# Patient Record
Sex: Female | Born: 1950
Health system: Southern US, Community
[De-identification: ages and names within clinical notes are randomized; demographics above are authoritative.]

## PROBLEM LIST (undated history)

## (undated) DIAGNOSIS — I10 Essential (primary) hypertension: Secondary | ICD-10-CM

## (undated) DIAGNOSIS — M1990 Unspecified osteoarthritis, unspecified site: Secondary | ICD-10-CM

## (undated) DIAGNOSIS — E785 Hyperlipidemia, unspecified: Secondary | ICD-10-CM

## (undated) DIAGNOSIS — K219 Gastro-esophageal reflux disease without esophagitis: Secondary | ICD-10-CM

## (undated) DIAGNOSIS — J449 Chronic obstructive pulmonary disease, unspecified: Secondary | ICD-10-CM

## (undated) DIAGNOSIS — E039 Hypothyroidism, unspecified: Secondary | ICD-10-CM

## (undated) HISTORY — DX: Essential (primary) hypertension: I10

## (undated) HISTORY — DX: Hypothyroidism, unspecified: E03.9

## (undated) HISTORY — DX: Gastro-esophageal reflux disease without esophagitis: K21.9

## (undated) HISTORY — PX: SHOULDER ARTHROSCOPY: SHX128

## (undated) HISTORY — DX: Hyperlipidemia, unspecified: E78.5

## (undated) HISTORY — PX: TONSILLECTOMY: SUR1361

---

## 1998-08-09 ENCOUNTER — Emergency Department (HOSPITAL_COMMUNITY): Admission: EM | Admit: 1998-08-09 | Discharge: 1998-08-09 | Payer: Self-pay | Admitting: Emergency Medicine

## 1999-12-01 ENCOUNTER — Other Ambulatory Visit: Admission: RE | Admit: 1999-12-01 | Discharge: 1999-12-01 | Payer: Self-pay | Admitting: Family Medicine

## 2001-08-11 ENCOUNTER — Ambulatory Visit (HOSPITAL_COMMUNITY): Admission: RE | Admit: 2001-08-11 | Discharge: 2001-08-11 | Payer: Self-pay | Admitting: Gastroenterology

## 2004-03-05 ENCOUNTER — Other Ambulatory Visit: Admission: RE | Admit: 2004-03-05 | Discharge: 2004-03-05 | Payer: Self-pay | Admitting: Family Medicine

## 2005-03-22 ENCOUNTER — Other Ambulatory Visit: Admission: RE | Admit: 2005-03-22 | Discharge: 2005-03-22 | Payer: Self-pay | Admitting: Family Medicine

## 2007-08-14 ENCOUNTER — Ambulatory Visit (HOSPITAL_COMMUNITY): Admission: RE | Admit: 2007-08-14 | Discharge: 2007-08-14 | Payer: Self-pay | Admitting: Gastroenterology

## 2009-03-10 ENCOUNTER — Other Ambulatory Visit: Admission: RE | Admit: 2009-03-10 | Discharge: 2009-03-10 | Payer: Self-pay | Admitting: Family Medicine

## 2010-06-03 ENCOUNTER — Other Ambulatory Visit: Payer: Self-pay | Admitting: Family Medicine

## 2010-06-03 ENCOUNTER — Other Ambulatory Visit
Admission: RE | Admit: 2010-06-03 | Discharge: 2010-06-03 | Payer: Self-pay | Source: Home / Self Care | Admitting: Family Medicine

## 2010-06-10 ENCOUNTER — Encounter
Admission: RE | Admit: 2010-06-10 | Discharge: 2010-06-10 | Payer: Self-pay | Source: Home / Self Care | Attending: Family Medicine | Admitting: Family Medicine

## 2010-09-29 NOTE — Op Note (Signed)
Wendy Blair, [name]:  [id number]   MEDICAL RECORD NO.:  [id number]          PATIENT TYPE:  AMB   LOCATION:  ENDO                         FACILITY:  [facility]   PHYSICIAN:  Anselmo Rod, M.D.  DATE OF BIRTH:  [birth year]   DATE OF PROCEDURE:  08/14/2007  DATE OF DISCHARGE:  08/14/2007                               OPERATIVE REPORT   PROCEDURE PERFORMED:  Screening colonoscopy.   ENDOSCOPIST:  Charna Elizabeth, M.D.   INSTRUMENT USED:  Pentax  video colonoscope.   INDICATIONS FOR PROCEDURE:  A 60 year old white female with a family  history of colonic polyps in multiple family members, undergoing  screening colonoscopy to rule out colonic polyps, masses etc.   PRE-PROCEDURE PREPARATION:  Informed consent was obtained  from the  patient.  The patient fasted four hours prior to the procedure and  prepped with a galon of Nulytely the night before and  the morning of  the procedure.  The risks and benefits of the procedure including 10%  missed rate of cancer and polyp were discussed with the patient as well.  Preprocedure physical, the patient stable vital signs, neck supple,  chest clear to auscultation, S1 and S2 regular, abdomen soft with normal  bowel sounds.  The patient has occasional rectal bleeding.   DESCRIPTION OF PROCEDURE:  The patient was placed in the left lateral  decubitus position and sedated with 60 mcg of Fentanyl, 6 mg of versed  given IV in incremental doses.  Once the patient was adequately sedated,  and maintained on low flow  oxygen and continuous cardiac monitoring,  then Pentax video colonoscope was advanced  from the rectum to the  cecum. An isolated diverticulum was noted in the cecum.  The appendiceal  orifice and cecum were clearly visualized and had no abnormalities  except for a cecal diverticulum. Internal hemorrhoids  were seen on  retroflexion.  The rest of the colonic mucosa appeared to be healthy and  without lesions.   IMPRESSION:  1. Small internal hemorrhoids seen on retroflexion.  2. Isolated diverticulum in the cecum.  3. Otherwise normal exam to the terminal ileum.   RECOMMENDATIONS:  1. Continue on high fiber diet with liberal fluid intake.  2. Repeat colonoscopy in the next 5 years unless the patient has any      abnormal symptoms in the interim, in which case, she should contact      the office immediately for further recommendations.  3. Outpatient follow up as needed in the future.      Anselmo Rod, M.D.  Electronically Signed     JNM/MEDQ  D:  08/17/2007  T:  08/17/2007  Job:  [id number]   cc:   Emeterio Reeve, MD  Fax: [phone]

## 2010-10-02 NOTE — Procedures (Signed)
[facility]  Patient:    Wendy Blair, Wendy Blair Visit Number: [id number] MRN: [id number]          Service Type: END Location: ENDO Attending Physician:  Charna Elizabeth Dictated by:   Anselmo Rod, M.D. Proc. Date: 08/11/01 Admit Date:  08/11/2001   CC:         Doreatha Lew, M.D.   Procedure Report  DATE OF BIRTH:  1950/07/22  PROCEDURE PERFORMED:  Colonoscopy.  ENDOSCOPIST:  Anselmo Rod, M.D.  INSTRUMENT USED:  Olympus video colonoscope.  INDICATION FOR PROCEDURE:  Rectal bleeding in a 60 year old white female with a family history of breast cancer and family history of colonic polyps.  Rule out polyps, masses, hemorrhoids, etc.  PREPROCEDURE PREPARATION:  Informed consent was procured from the patient. The patient was fasted for 8 hours prior to the procedure and prepped with a bottle of magnesium citrate and a gallon of NuLytely the night prior to the procedure.  PREPROCEDURE PHYSICAL:  Patient has stable vital signs.  NECK: Supple.  CHEST:  Clear to auscultation. S1, S2 regular.  ABDOMEN:  Soft with normal bowel sounds.  DESCRIPTION OF PROCEDURE:  The patient was placed in the left lateral decubitus position and sedated with 60 mg of Demerol and 6 mg of Versed intravenously.  Once the patient was adequately sedated and maintained on low-flow oxygen and continuous cardiac monitoring, the Olympus video colonoscope was advanced from the rectum to the cecum without difficulty. The patient had a fairly good prep.  No masses, polyps, erosions or ulcerations were seen.  There was no evidence of diverticulosis.  Prominent hemorrhoids were noted on retroflexion in the rectum.  These were small in size.  The patient tolerated the procedure well without complication. Appendiceal orifice and ileocecal valve were clearly visualized and photographed.  IMPRESSION:  Healthy-appearing colon up to the cecum, except for  small, nonbleeding internal hemorrhoids.  RECOMMENDATIONS: 1. A high-fiber diet has been recommended for the patient. 2. Anusol HC 2.5% suppositories one p.r. q.h.s. has been advised for now. 3. Repeat colorectal cancer screening in the next five years unless the    patient were to develop any abnormal symptoms in the interim. Dictated by:   Anselmo Rod, M.D. Attending Physician:  Charna Elizabeth DD:  08/11/01 TD:  08/12/01 Job: [id number]

## 2011-06-21 ENCOUNTER — Other Ambulatory Visit: Payer: Self-pay | Admitting: Family Medicine

## 2011-06-21 DIAGNOSIS — Z1231 Encounter for screening mammogram for malignant neoplasm of breast: Secondary | ICD-10-CM

## 2011-06-30 ENCOUNTER — Ambulatory Visit
Admission: RE | Admit: 2011-06-30 | Discharge: 2011-06-30 | Disposition: A | Discharge status: Home / Self Care | Source: Ambulatory Visit | Attending: Family Medicine | Admitting: Family Medicine

## 2011-06-30 DIAGNOSIS — Z1231 Encounter for screening mammogram for malignant neoplasm of breast: Secondary | ICD-10-CM

## 2012-06-22 ENCOUNTER — Other Ambulatory Visit (HOSPITAL_COMMUNITY): Payer: Self-pay | Admitting: Family Medicine

## 2012-06-22 DIAGNOSIS — R05 Cough: Secondary | ICD-10-CM

## 2012-06-22 DIAGNOSIS — R053 Chronic cough: Secondary | ICD-10-CM

## 2012-06-23 ENCOUNTER — Ambulatory Visit (HOSPITAL_COMMUNITY)
Admission: RE | Admit: 2012-06-23 | Discharge: 2012-06-23 | Disposition: A | Discharge status: Home / Self Care | Source: Ambulatory Visit | Attending: Family Medicine | Admitting: Family Medicine

## 2012-06-23 DIAGNOSIS — R0602 Shortness of breath: Secondary | ICD-10-CM | POA: Insufficient documentation

## 2012-06-23 DIAGNOSIS — S2249XA Multiple fractures of ribs, unspecified side, initial encounter for closed fracture: Secondary | ICD-10-CM | POA: Insufficient documentation

## 2012-06-23 DIAGNOSIS — J90 Pleural effusion, not elsewhere classified: Secondary | ICD-10-CM | POA: Insufficient documentation

## 2012-06-23 DIAGNOSIS — R05 Cough: Secondary | ICD-10-CM

## 2012-06-23 DIAGNOSIS — X58XXXA Exposure to other specified factors, initial encounter: Secondary | ICD-10-CM | POA: Insufficient documentation

## 2012-06-23 DIAGNOSIS — R053 Chronic cough: Secondary | ICD-10-CM

## 2012-06-23 DIAGNOSIS — R059 Cough, unspecified: Secondary | ICD-10-CM | POA: Insufficient documentation

## 2012-06-23 MED ORDER — IOHEXOL 350 MG/ML SOLN
100.0000 mL | Freq: Once | INTRAVENOUS | Status: AC | PRN
  Administered 2012-06-23: 100 mL via INTRAVENOUS

## 2012-08-16 ENCOUNTER — Ambulatory Visit (INDEPENDENT_AMBULATORY_CARE_PROVIDER_SITE_OTHER): Admitting: Internal Medicine

## 2012-08-16 ENCOUNTER — Other Ambulatory Visit

## 2012-08-16 ENCOUNTER — Encounter: Payer: Self-pay | Admitting: Internal Medicine

## 2012-08-16 VITALS — BP 140/72 | HR 89 | Ht 61.25 in | Wt 185.5 lb

## 2012-08-16 DIAGNOSIS — R053 Chronic cough: Secondary | ICD-10-CM

## 2012-08-16 DIAGNOSIS — R05 Cough: Secondary | ICD-10-CM

## 2012-08-16 DIAGNOSIS — R059 Cough, unspecified: Secondary | ICD-10-CM

## 2012-08-16 NOTE — Progress Notes (Signed)
08/16/12 61 yoF former smoker referred by Dr Mila Palmer - Ongoing cough and wheezing for past 5 months.  Her marriage separated in the summer of 2013 and she moved into her parents home. She is trying to clean up which he describes is a dusty, mold the place. Definitely less cough away from that house are at no previous history of asthma or pneumonia. She had an acute bronchitis in October of 2013, treated at urgent care with nebulizer prednisone and antibiotic. Persistent cough with scant clear mucus. Coughs till she retches. Triggering irritation in the back of her throat. Tussive rib fractures. Tramadol little help. Tussionex does help some. She is now on a prednisone taper. Little benefit from rescue albuterol inhaler or Dulera 200. Has done better with each of 5 rounds of prednisone tapering but begins coughing again after prednisone stops. Aware of postnasal drip but denies GERD. She is working as a Engineer, water with patient exposures. 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 CT chest 06/23/12- IMPRESSION:  Mildly displaced acute fractures of the right sixth, seventh and  eighth ribs with associated adjacent pleural thickening and a small  right pleural effusion. No pneumothorax is identified.  Original Report Authenticated By: Irish Lack, M.D.  Prior to Admission medications   Medication Sig Start Date End Date Taking? Authorizing Provider  buPROPion (WELLBUTRIN XL) 300 MG 24 hr tablet Take 300 mg by mouth daily.   Yes Historical Provider, MD  calcium carbonate (OS-CAL) 600 MG TABS Take 600 mg by mouth 2 (two) times daily with a meal.   Yes Historical Provider, MD  Cholecalciferol (VITAMIN D-3) 1000 UNITS CAPS Take 1 capsule by mouth 2 (two) times daily.   Yes Historical Provider, MD  cloNIDine (CATAPRES) 0.1 MG tablet Take 0.1 mg by mouth 2 (two) times daily.   Yes Historical Provider, MD  COD LIVER OIL PO Take 1 capsule by mouth daily.   Yes  Historical Provider, MD  GRAPE SEED EXTRACT PO Take 1 capsule by mouth daily.   Yes Historical Provider, MD  GREEN COFFEE BEAN PO Take 1 capsule by mouth daily.   Yes Historical Provider, MD  Chilton Si Tea, Camillia sinensis, (GREEN TEA EXTRACT PO) Take 1 capsule by mouth daily.   Yes Historical Provider, MD  levothyroxine (SYNTHROID) 50 MCG tablet Take 50 mcg by mouth daily before breakfast.   Yes Historical Provider, MD  loratadine (CLARITIN) 10 MG tablet Take 10 mg by mouth daily.   Yes Historical Provider, MD  montelukast (SINGULAIR) 10 MG tablet Take 10 mg by mouth at bedtime.   Yes Historical Provider, MD  Multiple Vitamins-Minerals (MULTIVITAMIN PO) Take 1 tablet by mouth daily.   Yes Historical Provider, MD  sertraline (ZOLOFT) 50 MG tablet Take 50 mg by mouth daily.   Yes Historical Provider, MD   Past Medical History  Diagnosis Date  . Hypertension   . Hypothyroidism   . Hyperlipidemia   . GERD (gastroesophageal reflux disease)    Past Surgical History  Procedure Laterality Date  . Tonsillectomy     Family History  Problem Relation Age of Onset  . Prostate cancer Father   . Lymphoma Brother   . Breast cancer Maternal Grandmother   . Stroke Paternal Grandmother   . Prostate cancer Brother    History   Social History  . Marital Status: Legally Separated    Spouse Name: N/A    Number of Children: N/A  . Years of Education: N/A  Occupational History  . Not on file.   Social History Main Topics  . Smoking status: Former Smoker -- 1.00 packs/day for 35 years    Types: Cigarettes    Quit date: 08/16/2005  . Smokeless tobacco: Never Used  . Alcohol Use: Yes  . Drug Use: No  . Sexually Active: Not on file   Other Topics Concern  . Not on file   Social History Narrative  . No narrative on file   ROS-see HPI Constitutional:   No-   weight loss, night sweats, fevers, chills, fatigue, lassitude. HEENT:   No-  headaches, difficulty swallowing, tooth/dental problems,  +sore throat,       +  sneezing, itching, ear ache, nasal congestion, +post nasal drip,  CV:  No-   chest pain, orthopnea, PND, swelling in lower extremities, anasarca,                                  dizziness, palpitations Resp: +  shortness of breath with exertion or at rest.              +   productive cough,  +non-productive cough,  No- coughing up of blood.              No-   change in color of mucus.  No- wheezing.   Skin: No-   rash or lesions. GI:  No-   heartburn, indigestion, abdominal pain, nausea, vomiting, diarrhea,                 change in bowel habits, loss of appetite GU: No-   dysuria, change in color of urine, no urgency or frequency.  No- flank pain. MS:  +   joint pain or swelling.  No- decreased range of motion.  No- back pain. Neuro-     nothing unusual Psych:  No- change in mood or affect. No depression or anxiety.  No memory loss.  OBJ- Physical Exam General- Alert, Oriented, Affect-appropriate, Distress- none acute. Overweight Skin- rash-none, lesions- none, excoriation- none Lymphadenopathy- none Head- atraumatic            Eyes- Gross vision intact, PERRLA, conjunctivae and secretions clear            Ears- Hearing, canals-normal            Nose- Clear, no-Septal dev, mucus, polyps, erosion, perforation             Throat- Mallampati II , mucosa +red , drainage- none, tonsils- atrophic Neck- flexible , trachea midline, no stridor , thyroid nl, carotid no bruit Chest - symmetrical excursion , unlabored           Heart/CV- RRR , no murmur , no gallop  , no rub, nl s1 s2                           - JVD- none , edema- none, stasis changes- none, varices- none           Lung- clear to P&A, wheeze- none, cough- none , dullness-none,            Chest wall- +pleural rub right lateral base Abd- tender-no, distended-no, bowel sounds-present, HSM- no Br/ Gen/ Rectal- Not done, not indicated Extrem- cyanosis- none, clubbing, none, atrophy- none, strength- nl Neuro-  grossly intact to observation

## 2012-08-16 NOTE — Patient Instructions (Addendum)
Order- lab Allergy Profile   Dx chronic cough  Order- schedule PFT  Sample Spiriva 1 daily  Consider formal evaluation of your home for mold/ dampness problem

## 2012-08-17 LAB — ALLERGY FULL PROFILE
Alternaria Alternata: <0.1 kU/L
Aspergillus fumigatus, m3: <0.1 kU/L
Bahia Grass: <0.1 kU/L
Bermuda Grass: <0.1 kU/L
Cat Dander: <0.1 kU/L
Curvularia lunata: <0.1 kU/L
D. farinae: <0.1 kU/L
Dog Dander: <0.1 kU/L
Fescue: <0.1 kU/L
Goldenrod: <0.1 kU/L
Helminthosporium halodes: <0.1 kU/L
Lamb's Quarters: <0.1 kU/L
Plantain: <0.1 kU/L
Sycamore Tree: <0.1 kU/L

## 2012-08-18 ENCOUNTER — Telehealth: Payer: Self-pay | Admitting: Internal Medicine

## 2012-08-18 NOTE — Telephone Encounter (Signed)
Result Note    Allergy antibodies are not elevated. We will discuss at next ov, but this suggests non-allergic reason for symptoms   --- I spoke with patient about results and she verbalized understanding and had no questions

## 2012-08-18 NOTE — Progress Notes (Signed)
Quick Note:  lmomtcb ______ 

## 2012-08-26 ENCOUNTER — Encounter: Payer: Self-pay | Admitting: Internal Medicine

## 2012-08-26 DIAGNOSIS — J45998 Other asthma: Secondary | ICD-10-CM | POA: Insufficient documentation

## 2012-08-26 NOTE — Assessment & Plan Note (Addendum)
Not clear if there is a reflux component with red throat and throat irritation as a trigger for coughing episodes. Reflux precautions were taught. Plan-schedule PFT, lab for allergy profile, try Spiriva. Educated on environmental dust and mold precautions with consideration of having home formally tested.

## 2012-09-22 ENCOUNTER — Encounter: Payer: Self-pay | Admitting: Internal Medicine

## 2012-09-22 ENCOUNTER — Ambulatory Visit (INDEPENDENT_AMBULATORY_CARE_PROVIDER_SITE_OTHER): Admitting: Internal Medicine

## 2012-09-22 ENCOUNTER — Ambulatory Visit: Admitting: Emergency Medicine

## 2012-09-22 VITALS — BP 152/90 | HR 84 | Ht 60.0 in | Wt 183.0 lb

## 2012-09-22 DIAGNOSIS — R059 Cough, unspecified: Secondary | ICD-10-CM

## 2012-09-22 DIAGNOSIS — J45909 Unspecified asthma, uncomplicated: Secondary | ICD-10-CM

## 2012-09-22 DIAGNOSIS — R05 Cough: Secondary | ICD-10-CM

## 2012-09-22 LAB — PULMONARY FUNCTION TEST

## 2012-09-22 NOTE — Patient Instructions (Addendum)
Sample Anoro Ellipta    1 puff then rinse mouth, once daily. Try this instead of Dulera and Spiriva. Let us know how you do with this.

## 2012-09-22 NOTE — Progress Notes (Signed)
PFT done today. 

## 2012-09-22 NOTE — Progress Notes (Signed)
08/16/12 61 yoF former smoker referred by Dr Mila Palmer - Ongoing cough and wheezing for past 5 months.  Her marriage separated in the summer of 2013 and she moved into her parents home. She is trying to clean up what she describes as a dusty, moldy place. Definitely less cough away from that house; no previous history of asthma or pneumonia. She had an acute bronchitis in October of 2013, treated at urgent care with nebulizer prednisone and antibiotic. Persistent cough with scant clear mucus. Coughs till she retches. Triggering irritation in the back of her throat. Tussive rib fractures. Tramadol little help. Tussionex does help some. She is now on a prednisone taper. Little benefit from rescue albuterol inhaler or Dulera 200. Has done better with each of 5 rounds of prednisone tapering but begins coughing again after prednisone stops. Aware of postnasal drip but denies GERD. She is working as a Engineer, water with patient exposures. 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 CT chest 06/23/12- IMPRESSION:  Mildly displaced acute fractures of the right sixth, seventh and  eighth ribs with associated adjacent pleural thickening and a small  right pleural effusion. No pneumothorax is identified.  Original Report Authenticated By: Irish Lack, M.D.  09/22/12- 72 yoF former smoker referred by Dr Mila Palmer - Ongoing cough and wheezing. Tussive rib fractures, Post-nasal drip FOLLOWS FOR: Reports that her breathing and cough have gotten worse. Denies chest tightness, chest pain or wheezing. States from time to time she does get up some mucus with her cough. Better air conditioning. Scant clear mucus. No longer has rib pain. Denies any reflux or heartburn at all but is following up with Dr. Lavonia Drafts. No benefit from albuterol or Dulera 200. Spiriva might help a little. Allergy Profile-negative-total IgE 11.9 PFT 09/22/12-: Mild obstructive airways disease with response to  bronchodilator, air trapping/increased residual volume. Normal diffusion capacity. FVC 2.01/71%, FEV1 1.44/66%, FEV1/FVC 0.72, FEF 25-75% 0.72, RV 141%, DLCO 100%.  ROS-see HPI Constitutional:   No-   weight loss, night sweats, fevers, chills, fatigue, lassitude. HEENT:   No-  headaches, difficulty swallowing, tooth/dental problems, +sore throat,       +  sneezing, itching, ear ache, nasal congestion, +post nasal drip,  CV:  No-   chest pain, orthopnea, PND, swelling in lower extremities, anasarca,                                  dizziness, palpitations Resp: +  shortness of breath with exertion or at rest.              +   productive cough,  +non-productive cough,  No- coughing up of blood.              No-   change in color of mucus.  No- wheezing.   Skin: No-   rash or lesions. GI:  No-   heartburn, indigestion, abdominal pain, nausea, vomiting,  GU:  MS:  +   joint pain or swelling.   Neuro-     nothing unusual Psych:  No- change in mood or affect. No depression or anxiety.  No memory loss.  OBJ- Physical Exam General- Alert, Oriented, Affect-appropriate, Distress- none acute. Overweight Skin- rash-none, lesions- none, excoriation- none Lymphadenopathy- none Head- atraumatic            Eyes- Gross vision intact, PERRLA, conjunctivae and secretions clear  Ears- Hearing, canals-normal            Nose- Clear, no-Septal dev, mucus, polyps, erosion, perforation             Throat- Mallampati II , mucosa +red , drainage- none, tonsils- atrophic Neck- flexible , trachea midline, no stridor , thyroid nl, carotid no bruit Chest - symmetrical excursion , unlabored           Heart/CV- RRR , no murmur , no gallop  , no rub, nl s1 s2                           - JVD- none , edema- none, stasis changes- none, varices- none           Lung- clear to P&A, wheeze- none, cough- none , dullness-none,            Chest wall- +pleural rub right lateral base Abd-  Br/ Gen/ Rectal- Not done,  not indicated Extrem- cyanosis- none, clubbing, none, atrophy- none, strength- nl Neuro- grossly intact to observation

## 2012-10-04 NOTE — Assessment & Plan Note (Signed)
Mild to moderate obstructive airways disease with slight response to bronchodilator. This began as a post viral syndrome and may be a bronchiolitis. Plan- Sample Anoro 1 puff daily

## 2012-11-03 ENCOUNTER — Ambulatory Visit (INDEPENDENT_AMBULATORY_CARE_PROVIDER_SITE_OTHER): Admitting: Internal Medicine

## 2012-11-03 ENCOUNTER — Encounter: Payer: Self-pay | Admitting: Internal Medicine

## 2012-11-03 VITALS — BP 140/72 | HR 88 | Ht 60.0 in | Wt 188.0 lb

## 2012-11-03 DIAGNOSIS — J4520 Mild intermittent asthma, uncomplicated: Secondary | ICD-10-CM

## 2012-11-03 DIAGNOSIS — J309 Allergic rhinitis, unspecified: Secondary | ICD-10-CM

## 2012-11-03 DIAGNOSIS — J45909 Unspecified asthma, uncomplicated: Secondary | ICD-10-CM

## 2012-11-03 MED ORDER — MOMETASONE FURO-FORMOTEROL FUM 200-5 MCG/ACT IN AERO: INHALATION_SPRAY | RESPIRATORY_TRACT | Status: DC

## 2012-11-03 NOTE — Patient Instructions (Addendum)
Sample and script Dulera 200   2 puffs then rinse mouth, twice daily - maintenance inhaler                     You can still use the albuterol rescue inhaler as needed  Sample Tudorza inhaler   1 puff, twice daily   You can use this right on top of the [facility] if you want  Sample Dymista nasal spray   1-2 puffs each nostril once daily at bedtime

## 2012-11-03 NOTE — Progress Notes (Signed)
08/16/12 61 yoF former smoker referred by Dr Mila Palmer - Ongoing cough and wheezing for past 5 months.  Her marriage separated in the summer of 2013 and she moved into her parents home. She is trying to clean up what she describes as a dusty, moldy place. Definitely less cough away from that house; no previous history of asthma or pneumonia. She had an acute bronchitis in October of 2013, treated at urgent care with nebulizer prednisone and antibiotic. Persistent cough with scant clear mucus. Coughs till she retches. Triggering irritation in the back of her throat. Tussive rib fractures. Tramadol little help. Tussionex does help some. She is now on a prednisone taper. Little benefit from rescue albuterol inhaler or Dulera 200. Has done better with each of 5 rounds of prednisone tapering but begins coughing again after prednisone stops. Aware of postnasal drip but denies GERD. She is working as a Engineer, water with patient exposures. 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 CT chest 06/23/12- IMPRESSION:  Mildly displaced acute fractures of the right sixth, seventh and  eighth ribs with associated adjacent pleural thickening and a small  right pleural effusion. No pneumothorax is identified.  Original Report Authenticated By: Irish Lack, M.D.  09/22/12- 58 yoF former smoker referred by Dr Mila Palmer - Ongoing cough and wheezing. Tussive rib fractures, Post-nasal drip FOLLOWS FOR: Reports that her breathing and cough have gotten worse. Denies chest tightness, chest pain or wheezing. States from time to time she does get up some mucus with her cough. Better air conditioning. Scant clear mucus. No longer has rib pain. Denies any reflux or heartburn at all but is following up with Dr. Lavonia Drafts. No benefit from albuterol or Dulera 200. Spiriva might help a little. Allergy Profile-negative-total IgE 11.9 PFT 09/22/12-: Mild obstructive airways disease with response to  bronchodilator, air trapping/increased residual volume. Normal diffusion capacity. FVC 2.01/71%, FEV1 1.44/66%, FEV1/FVC 0.72, FEF 25-75% 0.72, RV 141%, DLCO 100%.  11/03/12- 61 yoF former smoker referred by Dr Mila Palmer - Ongoing cough and wheezing. Tussive rib fractures, Post-nasal drip FOLLOWS FOR: continues to have good and bad days with cough-productive at times-clear in color. Always coughs if lying left side down. Cough will make her sit up. Coughs more with drinking water or lying on her back. Albuterol helps. Disliked the Spiriva and Anoro.  ROS-see HPI Constitutional:   No-   weight loss, night sweats, fevers, chills, fatigue, lassitude. HEENT:   No-  headaches, difficulty swallowing, tooth/dental problems, +sore throat,       +  sneezing, itching, ear ache, nasal congestion, +post nasal drip,  CV:  No-   chest pain, orthopnea, PND, swelling in lower extremities, anasarca,                                  dizziness, palpitations Resp: +  shortness of breath with exertion or at rest.              +   productive cough,  +non-productive cough,  No- coughing up of blood.              No-   change in color of mucus.  No- wheezing.   Skin: No-   rash or lesions. GI:  No-   heartburn, indigestion, abdominal pain, nausea, vomiting,  GU:  MS:  +   joint pain or swelling.   Neuro-  nothing unusual Psych:  No- change in mood or affect. No depression or anxiety.  No memory loss.  OBJ- Physical Exam General- Alert, Oriented, Affect-appropriate, Distress- none acute. Overweight Skin- rash-none, lesions- none, excoriation- none Lymphadenopathy- none Head- atraumatic            Eyes- Gross vision intact, PERRLA, conjunctivae and secretions clear            Ears- Hearing, canals-normal            Nose- +sniffing and snorting, no-Septal dev, mucus, polyps, erosion, perforation             Throat- Mallampati II , mucosa +red , drainage+clear mucus, tonsils- atrophic Neck- flexible ,  trachea midline, no stridor , thyroid nl, carotid no bruit Chest - symmetrical excursion , unlabored           Heart/CV- RRR , no murmur , no gallop  , no rub, nl s1 s2                           - JVD- none , edema- none, stasis changes- none, varices- none           Lung- clear to P&A, wheeze- none, cough- none , dullness-none,            Chest wall- no rub Abd-  Br/ Gen/ Rectal- Not done, not indicated Extrem- cyanosis- none, clubbing, none, atrophy- none, strength- nl Neuro- grossly intact to observation

## 2012-11-06 ENCOUNTER — Telehealth: Payer: Self-pay | Admitting: Internal Medicine

## 2012-11-06 MED ORDER — AZITHROMYCIN 250 MG PO TABS: ORAL_TABLET | ORAL | Status: DC

## 2012-11-06 NOTE — Telephone Encounter (Signed)
Pt was seen on June 20th and over the weekend Pt state has ran fever of 102 all weekend and now is down to 101. Pt taking ibuprofen ,coughing enough to gag,  Sob,wheezing, sinus drainage. Allergies  Allergen Reactions  . Penicillins     Rash and itching   Dr Maple Hudson please advise Thank you

## 2012-11-06 NOTE — Telephone Encounter (Signed)
Per CY-offer Zpak #1 take as directed no refills.  

## 2012-11-06 NOTE — Telephone Encounter (Signed)
Called spoke with patient, advised of CY's recs as stated below.  Pt okay with these recs and verbalized her understanding.  Rx sent to verified pharmacy.  Pt aware to call back if symptoms do not improve or worsen.

## 2012-11-19 DIAGNOSIS — J309 Allergic rhinitis, unspecified: Secondary | ICD-10-CM | POA: Insufficient documentation

## 2012-11-19 NOTE — Assessment & Plan Note (Signed)
Postnasal drip. Plan-sample Dymista

## 2012-11-19 NOTE — Assessment & Plan Note (Signed)
Cough he quit of lung for asthma Plan-refill Elwin Sleight, try [place]

## 2012-12-18 ENCOUNTER — Telehealth: Payer: Self-pay | Admitting: Internal Medicine

## 2012-12-18 MED ORDER — ALBUTEROL SULFATE HFA 108 (90 BASE) MCG/ACT IN AERS: 2.0000 | INHALATION_SPRAY | RESPIRATORY_TRACT | Status: DC | PRN

## 2012-12-18 NOTE — Telephone Encounter (Signed)
[facility] Nothing further was needed

## 2012-12-18 NOTE — Telephone Encounter (Signed)
Pt does not have proair on medlist and this does not show we have filed this in the past. Please advise if okay to send in proair RX for pt Dr. Maple Hudson thanks Last OV 11/03/12

## 2012-12-18 NOTE — Telephone Encounter (Signed)
Per CY- ok RX ProAir HFA #1 2 puffs Q4H prn refill prn.

## 2013-02-09 ENCOUNTER — Encounter: Payer: Self-pay | Admitting: Internal Medicine

## 2013-02-09 ENCOUNTER — Ambulatory Visit (INDEPENDENT_AMBULATORY_CARE_PROVIDER_SITE_OTHER): Admitting: Internal Medicine

## 2013-02-09 ENCOUNTER — Ambulatory Visit: Admitting: Internal Medicine

## 2013-02-09 VITALS — BP 150/80 | HR 114 | Ht 60.0 in | Wt 186.6 lb

## 2013-02-09 DIAGNOSIS — J45909 Unspecified asthma, uncomplicated: Secondary | ICD-10-CM

## 2013-02-09 DIAGNOSIS — J45998 Other asthma: Secondary | ICD-10-CM

## 2013-02-09 MED ORDER — BUDESONIDE-FORMOTEROL FUMARATE 160-4.5 MCG/ACT IN AERO: INHALATION_SPRAY | RESPIRATORY_TRACT | Status: DC

## 2013-02-09 NOTE — Patient Instructions (Addendum)
Sample and script Symbicort 160    2 puffs then rinse mouth twice every day- maintenance     Use this instead of Dulera  Please call as needed  Be carefull chewing and swallowing and don't lie down for at least an hour after eating to avoid reflux.

## 2013-02-09 NOTE — Progress Notes (Signed)
08/16/12 61 yoF former smoker referred by Dr Mila Palmer - Ongoing cough and wheezing for past 5 months.  Her marriage separated in the summer of 2013 and she moved into her parents home. She is trying to clean up what she describes as a dusty, moldy place. Definitely less cough away from that house; no previous history of asthma or pneumonia. She had an acute bronchitis in October of 2013, treated at urgent care with nebulizer prednisone and antibiotic. Persistent cough with scant clear mucus. Coughs till she retches. Triggering irritation in the back of her throat. Tussive rib fractures. Tramadol little help. Tussionex does help some. She is now on a prednisone taper. Little benefit from rescue albuterol inhaler or Dulera 200. Has done better with each of 5 rounds of prednisone tapering but begins coughing again after prednisone stops. Aware of postnasal drip but denies GERD. She is working as a Engineer, water with patient exposures. 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 CT chest 06/23/12- IMPRESSION:  Mildly displaced acute fractures of the right sixth, seventh and  eighth ribs with associated adjacent pleural thickening and a small  right pleural effusion. No pneumothorax is identified.  Original Report Authenticated By: Irish Lack, M.D.  09/22/12- 60 yoF former smoker referred by Dr Mila Palmer - Ongoing cough and wheezing. Tussive rib fractures, Post-nasal drip FOLLOWS FOR: Reports that her breathing and cough have gotten worse. Denies chest tightness, chest pain or wheezing. States from time to time she does get up some mucus with her cough. Better air conditioning. Scant clear mucus. No longer has rib pain. Denies any reflux or heartburn at all but is following up with Dr. Lavonia Drafts. No benefit from albuterol or Dulera 200. Spiriva might help a little. Allergy Profile-negative-total IgE 11.9 PFT 09/22/12-: Mild obstructive airways disease with response to  bronchodilator, air trapping/increased residual volume. Normal diffusion capacity. FVC 2.01/71%, FEV1 1.44/66%, FEV1/FVC 0.72, FEF 25-75% 0.72, RV 141%, DLCO 100%.  11/03/12- 61 yoF former smoker referred by Dr Mila Palmer - Ongoing cough and wheezing. Tussive rib fractures, Post-nasal drip FOLLOWS FOR: continues to have good and bad days with cough-productive at times-clear in color. Always coughs if lying left side down. Cough will make her sit up. Coughs more with drinking water or lying on her back. Albuterol helps. Disliked the Spiriva and Anoro.  02/09/13- 61 yoF former smoker referred by Dr Mila Palmer - Ongoing cough and wheezing. Tussive rib fractures, Post-nasal drip FOLLOWS FOR: continues to have cough; has not had Dulera inhaler since last visit(insurance would not cover) Gets flu shot at work Occasional wheeze and cough. Gag seasonally but not aware of reflux. Not using nasal spray regularly.  ROS-see HPI Constitutional:   No-   weight loss, night sweats, fevers, chills, fatigue, lassitude. HEENT:   No-  headaches, difficulty swallowing, tooth/dental problems, +sore throat,       No-  sneezing, itching, ear ache, nasal congestion, +post nasal drip,  CV:  No-   chest pain, orthopnea, PND, swelling in lower extremities, anasarca, dizziness, palpitations Resp: +  shortness of breath with exertion or at rest.              No- productive cough,  +non-productive cough,  No- coughing up of blood.              No-   change in color of mucus.  No- wheezing.   Skin: No-   rash or lesions. GI:  No-  heartburn, indigestion, abdominal pain, nausea, vomiting,  GU:  MS:  +   joint pain or swelling.   Neuro-     nothing unusual Psych:  No- change in mood or affect. No depression or anxiety.  No memory loss.  OBJ- Physical Exam General- Alert, Oriented, Affect-appropriate, Distress- none acute. Overweight Skin- rash-none, lesions- none, excoriation- none Lymphadenopathy- none Head-  atraumatic            Eyes- Gross vision intact, PERRLA, conjunctivae and secretions clear            Ears- Hearing, canals-normal            Nose- +mild turbinate edema, no-Septal dev, mucus, polyps, erosion, perforation             Throat- Mallampati II , mucosa +red , drainage+clear mucus, tonsils- atrophic Neck- flexible , trachea midline, no stridor , thyroid nl, carotid no bruit Chest - symmetrical excursion , unlabored           Heart/CV- RRR , no murmur , no gallop  , no rub, nl s1 s2                           - JVD- none , edema- none, stasis changes- none, varices- none           Lung- clear to P&A, wheezy cough+ , dullness-none,            Chest wall- no rub Abd-  Br/ Gen/ Rectal- Not done, not indicated Extrem- cyanosis- none, clubbing, none, atrophy- none, strength- nl Neuro- grossly intact to observation

## 2013-02-18 NOTE — Assessment & Plan Note (Signed)
Discussed use of a maintenance controller Plan-add Symbicort 160

## 2013-02-27 ENCOUNTER — Other Ambulatory Visit: Payer: Self-pay | Admitting: Internal Medicine

## 2013-02-27 MED ORDER — FLUTICASONE-SALMETEROL 250-50 MCG/DOSE IN AEPB: 1.0000 | INHALATION_SPRAY | Freq: Two times a day (BID) | RESPIRATORY_TRACT | Status: DC

## 2013-02-27 NOTE — Telephone Encounter (Signed)
I called verbal Rx to [facility] at [facility]; changed on medication list and patient is aware of this change. Pt will come by the office this afternoon so I may teach her how to use Advair.

## 2013-06-11 ENCOUNTER — Telehealth: Payer: Self-pay | Admitting: Internal Medicine

## 2013-06-11 MED ORDER — FLUTICASONE-SALMETEROL 250-50 MCG/DOSE IN AEPB: 1.0000 | INHALATION_SPRAY | Freq: Two times a day (BID) | RESPIRATORY_TRACT | Status: DC

## 2013-06-11 NOTE — Telephone Encounter (Signed)
Spoke with pt and is aware of [facility] recs. RX has been sent. notihng further needed

## 2013-06-11 NOTE — Telephone Encounter (Signed)
lmomtcb x1 at #'s left

## 2013-06-11 NOTE — Telephone Encounter (Signed)
She should have an active script for Advair 250 in force. I would start that first. If it doesn't control here, we will call in a short term script for pred taper.

## 2013-06-11 NOTE — Telephone Encounter (Signed)
Spoke with pt. States that she has been coughing with production of clear mucus and wheezing x3 days. Has not had her maintence inhaler due to lack of insurance. She now is working for [employer] and has insurance. Wanting to know if she needs to get back on her inhaler or can we just call in prednisone?  Allergies  Allergen Reactions  . Penicillins     Rash and itching    Current Outpatient Prescriptions on File Prior to Visit  Medication Sig Dispense Refill  . albuterol (PROAIR HFA) 108 (90 BASE) MCG/ACT inhaler Inhale 2 puffs into the lungs every 4 (four) hours as needed for wheezing.  3 Inhaler  prn  . buPROPion (WELLBUTRIN XL) 300 MG 24 hr tablet Take 300 mg by mouth daily.      . calcium carbonate (OS-CAL) 600 MG TABS Take 600 mg by mouth 2 (two) times daily with a meal.      . Cholecalciferol (VITAMIN D-3) 1000 UNITS CAPS Take 1 capsule by mouth 2 (two) times daily.      . cloNIDine (CATAPRES) 0.1 MG tablet Take 0.1 mg by mouth 2 (two) times daily.      . COD LIVER OIL PO Take 1 capsule by mouth daily.      . Fluticasone-Salmeterol (ADVAIR DISKUS) 250-50 MCG/DOSE AEPB Inhale 1 puff into the lungs 2 (two) times daily. RINSE MOUTH WELL AFTER USE  60 each  11  . GRAPE SEED EXTRACT PO Take 1 capsule by mouth daily.      Marland Kitchen GREEN COFFEE BEAN PO Take 1 capsule by mouth daily.      Nyoka Cowden Tea, Camillia sinensis, (GREEN TEA EXTRACT PO) Take 1 capsule by mouth daily.      Marland Kitchen levothyroxine (SYNTHROID) 50 MCG tablet Take 50 mcg by mouth daily before breakfast.      . loratadine (CLARITIN) 10 MG tablet Take 10 mg by mouth daily.      . montelukast (SINGULAIR) 10 MG tablet Take 10 mg by mouth at bedtime.      . Multiple Vitamins-Minerals (MULTIVITAMIN PO) Take 1 tablet by mouth daily.      . sertraline (ZOLOFT) 50 MG tablet Take 50 mg by mouth daily.       No current facility-administered medications on file prior to visit.    CY - please advise. Thanks.

## 2013-06-11 NOTE — Telephone Encounter (Signed)
Returning call can be reached at [phone] for the next 30 minutes and at [phone] after 5p.Wendy Blair

## 2013-06-12 ENCOUNTER — Ambulatory Visit: Admitting: Internal Medicine

## 2013-06-13 ENCOUNTER — Telehealth: Payer: Self-pay | Admitting: Internal Medicine

## 2013-06-13 MED ORDER — PREDNISONE 10 MG PO TABS: ORAL_TABLET | ORAL | Status: DC

## 2013-06-13 NOTE — Telephone Encounter (Signed)
Offer prednisone 10 mg, # 20, 4 X 2 DAYS, 3 X 2 DAYS, 2 X 2 DAYS, 1 X 2 DAYS When this finishes, start back on Advair 250.Marland Kitchen

## 2013-06-13 NOTE — Telephone Encounter (Signed)
Pt returned call.  Spoke with patient who reported that she has had 4 doses of the Advair rx'd by CDY.  She is still waking up coughing and wheezing and during the day as well.  Mucus is clear and frothy, increased SOB, runny nose with clear mucus, PND and sweats.  Pt denies any fever, chills, nausea, vomiting, hemoptysis.  Pt is requesting further recs from [place]. Dr Annamaria Boots please advise, thank you.

## 2013-06-13 NOTE — Telephone Encounter (Signed)
lmomtcb on pt's named VM 

## 2013-06-13 NOTE — Telephone Encounter (Signed)
Per last phone note 06-11-13:  Deneise Lever, MD at 06/11/2013 5:00 PM     Status: Signed        She should have an active script for Advair 250 in force. I would start that first. If it doesn't control here, we will call in a short term script for pred taper.    [id number] to speak to pt. [place] Bing, CMA

## 2013-06-13 NOTE — Telephone Encounter (Signed)
Spoke with the pt and notified of recs per [facility]  She verbalized understanding and denies any questions  Rx was sent to pharm

## 2013-06-13 NOTE — Telephone Encounter (Signed)
Pt returned call

## 2013-06-15 ENCOUNTER — Other Ambulatory Visit: Payer: Self-pay | Admitting: Family Medicine

## 2013-06-15 ENCOUNTER — Other Ambulatory Visit (HOSPITAL_COMMUNITY)
Admission: RE | Admit: 2013-06-15 | Discharge: 2013-06-15 | Disposition: A | Discharge status: Home / Self Care | Payer: 59 | Source: Ambulatory Visit | Attending: Family Medicine | Admitting: Family Medicine

## 2013-06-15 DIAGNOSIS — Z124 Encounter for screening for malignant neoplasm of cervix: Secondary | ICD-10-CM | POA: Insufficient documentation

## 2013-06-29 ENCOUNTER — Ambulatory Visit (INDEPENDENT_AMBULATORY_CARE_PROVIDER_SITE_OTHER): Payer: 59 | Admitting: Internal Medicine

## 2013-06-29 ENCOUNTER — Encounter: Payer: Self-pay | Admitting: Internal Medicine

## 2013-06-29 VITALS — BP 140/82 | HR 93 | Ht 60.0 in | Wt 185.0 lb

## 2013-06-29 DIAGNOSIS — J45909 Unspecified asthma, uncomplicated: Secondary | ICD-10-CM

## 2013-06-29 DIAGNOSIS — J45998 Other asthma: Secondary | ICD-10-CM

## 2013-06-29 MED ORDER — FLUTICASONE-SALMETEROL 115-21 MCG/ACT IN AERO
INHALATION_SPRAY | RESPIRATORY_TRACT | Status: DC
Start: 2013-06-29 — End: 2020-01-30

## 2013-06-29 NOTE — Patient Instructions (Addendum)
Script printed for Advair HFA. You can try this in comparison with Advair diskus if your insurance will cover it.

## 2013-06-29 NOTE — Progress Notes (Signed)
08/16/12 61 yoF former smoker referred by Dr Jonathon Jordan - Ongoing cough and wheezing for past 5 months.  Her marriage separated in the summer of 2013 and she moved into her parents home. She is trying to clean up what she describes as a dusty, moldy place. Definitely less cough away from that house; no previous history of asthma or pneumonia. She had an acute bronchitis in October of 2013, treated at urgent care with nebulizer prednisone and antibiotic. Persistent cough with scant clear mucus. Coughs till she retches. Triggering irritation in the back of her throat. Tussive rib fractures. Tramadol little help. Tussionex does help some. She is now on a prednisone taper. Little benefit from rescue albuterol inhaler or Dulera 200. Has done better with each of 5 rounds of prednisone tapering but begins coughing again after prednisone stops. Aware of postnasal drip but denies GERD. She is working as a Scientist, research (physical sciences) with patient exposures. 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 CT chest 06/23/12- IMPRESSION:  Mildly displaced acute fractures of the right sixth, seventh and  eighth ribs with associated adjacent pleural thickening and a small  right pleural effusion. No pneumothorax is identified.  Original Report Authenticated By: Aletta Edouard, M.D.  09/22/12- 48 yoF former smoker referred by Dr Jonathon Jordan - Ongoing cough and wheezing. Tussive rib fractures, Post-nasal drip FOLLOWS FOR: Reports that her breathing and cough have gotten worse. Denies chest tightness, chest pain or wheezing. States from time to time she does get up some mucus with her cough. Better air conditioning. Scant clear mucus. No longer has rib pain. Denies any reflux or heartburn at all but is following up with Dr. Hoyt Koch. No benefit from albuterol or Dulera 200. Spiriva might help a little. Allergy Profile-negative-total IgE 11.9 PFT 09/22/12-: Mild obstructive airways disease with response to  bronchodilator, air trapping/increased residual volume. Normal diffusion capacity. FVC 2.01/71%, FEV1 1.44/66%, FEV1/FVC 0.72, FEF 25-75% 0.72, RV 141%, DLCO 100%.  11/03/12- 89 yoF former smoker referred by Dr Jonathon Jordan - Ongoing cough and wheezing. Tussive rib fractures, Post-nasal drip FOLLOWS FOR: continues to have good and bad days with cough-productive at times-clear in color. Always coughs if lying left side down. Cough will make her sit up. Coughs more with drinking water or lying on her back. Albuterol helps. Disliked the Spiriva and Anoro.  02/09/13- 43 yoF former smoker referred by Dr Jonathon Jordan - Ongoing cough and wheezing. Tussive rib fractures, Post-nasal drip FOLLOWS FOR: continues to have cough; has not had Dulera inhaler since last visit(insurance would not cover) Gets flu shot at work Occasional wheeze and cough. Gag seasonally but not aware of reflux. Not using nasal spray regularly.  06/29/13- 34 yoF former smoker referred by Dr Jonathon Jordan - Ongoing cough and wheezing. Tussive rib fractures, Post-nasal drip FOLLOWS FOR:  Cough and breathing improved since last OV-- No concerns today  She prefers an HFA metered inhaler but insurance prefers Advair brand.  ROS-see HPI Constitutional:   No-   weight loss, night sweats, fevers, chills, fatigue, lassitude. HEENT:   No-  headaches, difficulty swallowing, tooth/dental problems, +sore throat,       No-  sneezing, itching, ear ache, nasal congestion, +post nasal drip,  CV:  No-   chest pain, orthopnea, PND, swelling in lower extremities, anasarca, dizziness, palpitations Resp: +  shortness of breath with exertion or at rest.              No- productive cough,  +  non-productive cough,  No- coughing up of blood.              No-   change in color of mucus.  No- wheezing.   Skin: No-   rash or lesions. GI:  No-   heartburn, indigestion, abdominal pain, nausea, vomiting,  GU:  MS:  +   joint pain or swelling.   Neuro-      nothing unusual Psych:  No- change in mood or affect. No depression or anxiety.  No memory loss.  OBJ- Physical Exam General- Alert, Oriented, Affect-appropriate, Distress- none acute. Overweight Skin- rash-none, +fever blister lip, excoriation- none Lymphadenopathy- none Head- atraumatic            Eyes- Gross vision intact, PERRLA, conjunctivae and secretions clear            Ears- Hearing, canals-normal            Nose- +mild turbinate edema, no-Septal dev, mucus, polyps, erosion, perforation             Throat- Mallampati II , mucosa +red , drainage+clear mucus, tonsils- atrophic Neck- flexible , trachea midline, no stridor , thyroid nl, carotid no bruit Chest - symmetrical excursion , unlabored           Heart/CV- RRR , no murmur , no gallop  , no rub, nl s1 s2                           - JVD- none , edema- none, stasis changes- none, varices- none           Lung- clear to P&A, wheeze-none cough-none , dullness-none,            Chest wall- no rub Abd-  Br/ Gen/ Rectal- Not done, not indicated Extrem- cyanosis- none, clubbing, none, atrophy- none, strength- nl Neuro- grossly intact to observation

## 2013-07-23 ENCOUNTER — Encounter: Payer: Self-pay | Admitting: Internal Medicine

## 2013-07-23 NOTE — Assessment & Plan Note (Signed)
Good control now. Plan-change to Advair 115 HFA for maintenance inhaler

## 2013-07-24 ENCOUNTER — Other Ambulatory Visit (HOSPITAL_COMMUNITY): Payer: Self-pay | Admitting: Family Medicine

## 2013-07-24 DIAGNOSIS — Z0000 Encounter for general adult medical examination without abnormal findings: Secondary | ICD-10-CM

## 2013-08-07 ENCOUNTER — Ambulatory Visit (HOSPITAL_COMMUNITY)
Admission: RE | Admit: 2013-08-07 | Discharge: 2013-08-07 | Disposition: A | Discharge status: Home / Self Care | Payer: 59 | Source: Ambulatory Visit | Attending: Family Medicine | Admitting: Family Medicine

## 2013-08-07 ENCOUNTER — Other Ambulatory Visit (HOSPITAL_COMMUNITY): Payer: Self-pay | Admitting: Family Medicine

## 2013-08-07 DIAGNOSIS — Z0000 Encounter for general adult medical examination without abnormal findings: Secondary | ICD-10-CM

## 2013-08-07 DIAGNOSIS — Z1231 Encounter for screening mammogram for malignant neoplasm of breast: Secondary | ICD-10-CM | POA: Insufficient documentation

## 2013-12-27 ENCOUNTER — Ambulatory Visit (INDEPENDENT_AMBULATORY_CARE_PROVIDER_SITE_OTHER): Payer: 59 | Admitting: Internal Medicine

## 2013-12-27 ENCOUNTER — Encounter: Payer: Self-pay | Admitting: Internal Medicine

## 2013-12-27 VITALS — BP 162/98 | HR 75 | Ht 60.0 in | Wt 190.0 lb

## 2013-12-27 DIAGNOSIS — R062 Wheezing: Secondary | ICD-10-CM

## 2013-12-27 NOTE — Patient Instructions (Signed)
We can continue present meds  Please call as needed 

## 2013-12-27 NOTE — Progress Notes (Signed)
08/16/12 61 yoF former smoker referred by Dr Jonathon Jordan - Ongoing cough and wheezing for past 5 months.  Her marriage separated in the summer of 2013 and she moved into her parents home. She is trying to clean up what she describes as a dusty, moldy place. Definitely less cough away from that house; no previous history of asthma or pneumonia. She had an acute bronchitis in October of 2013, treated at urgent care with nebulizer prednisone and antibiotic. Persistent cough with scant clear mucus. Coughs till she retches. Triggering irritation in the back of her throat. Tussive rib fractures. Tramadol little help. Tussionex does help some. She is now on a prednisone taper. Little benefit from rescue albuterol inhaler or Dulera 200. Has done better with each of 5 rounds of prednisone tapering but begins coughing again after prednisone stops. Aware of postnasal drip but denies GERD. She is working as a Scientist, research (physical sciences) with patient exposures. 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 CT chest 06/23/12- IMPRESSION:  Mildly displaced acute fractures of the right sixth, seventh and  eighth ribs with associated adjacent pleural thickening and a small  right pleural effusion. No pneumothorax is identified.  Original Report Authenticated By: Aletta Edouard, M.D.  09/22/12- 89 yoF former smoker referred by Dr Jonathon Jordan - Ongoing cough and wheezing. Tussive rib fractures, Post-nasal drip FOLLOWS FOR: Reports that her breathing and cough have gotten worse. Denies chest tightness, chest pain or wheezing. States from time to time she does get up some mucus with her cough. Better air conditioning. Scant clear mucus. No longer has rib pain. Denies any reflux or heartburn at all but is following up with Dr. Hoyt Koch. No benefit from albuterol or Dulera 200. Spiriva might help a little. Allergy Profile-negative-total IgE 11.9 PFT 09/22/12-: Mild obstructive airways disease with response to  bronchodilator, air trapping/increased residual volume. Normal diffusion capacity. FVC 2.01/71%, FEV1 1.44/66%, FEV1/FVC 0.72, FEF 25-75% 0.72, RV 141%, DLCO 100%.  11/03/12- 57 yoF former smoker referred by Dr Jonathon Jordan - Ongoing cough and wheezing. Tussive rib fractures, Post-nasal drip FOLLOWS FOR: continues to have good and bad days with cough-productive at times-clear in color. Always coughs if lying left side down. Cough will make her sit up. Coughs more with drinking water or lying on her back. Albuterol helps. Disliked the Spiriva and Anoro.  02/09/13- 78 yoF former smoker referred by Dr Jonathon Jordan - Ongoing cough and wheezing. Tussive rib fractures, Post-nasal drip FOLLOWS FOR: continues to have cough; has not had Dulera inhaler since last visit(insurance would not cover) Gets flu shot at work Occasional wheeze and cough. Gag seasonally but not aware of reflux. Not using nasal spray regularly.  06/29/13- 84 yoF former smoker referred by Dr Jonathon Jordan - Ongoing cough and wheezing. Tussive rib fractures, Post-nasal drip FOLLOWS FOR:  Cough and breathing improved since last OV-- No concerns today  She prefers an HFA metered inhaler but insurance prefers Advair brand.  12/27/13-63 yoF former smoker referred by Dr Jonathon Jordan - Ongoing cough and wheezing.  Hx Tussive rib fractures, Post-nasal drip  FOLLOWS FOR: has dry cough in the mornings; denies any wheezing or SOB.           ROS-see HPI Constitutional:   No-   weight loss, night sweats, fevers, chills, fatigue, lassitude. HEENT:   No-  headaches, difficulty swallowing, tooth/dental problems, +sore throat,       No-  sneezing, itching, ear ache, nasal congestion, +post nasal drip,  CV:  No-   chest pain, orthopnea, PND, swelling in lower extremities, anasarca, dizziness, palpitations Resp: +  shortness of breath with exertion or at rest.              No- productive cough,  +non-productive cough,  No- coughing up of  blood.              No-   change in color of mucus.  No- wheezing.   Skin: No-   rash or lesions. GI:  No-   heartburn, indigestion, abdominal pain, nausea, vomiting,  GU:  MS:  +   joint pain or swelling.   Neuro-     nothing unusual Psych:  No- change in mood or affect. No depression or anxiety.  No memory loss.  OBJ- Physical Exam General- Alert, Oriented, Affect-appropriate, Distress- none acute. Overweight Skin- rash-none, +fever blister lip, excoriation- none Lymphadenopathy- none Head- atraumatic            Eyes- Gross vision intact, PERRLA, conjunctivae and secretions clear            Ears- Hearing, canals-normal            Nose- +mild turbinate edema, no-Septal dev, mucus, polyps, erosion, perforation             Throat- Mallampati II , mucosa +red , drainage+clear mucus, tonsils- atrophic Neck- flexible , trachea midline, no stridor , thyroid nl, carotid no bruit Chest - symmetrical excursion , unlabored           Heart/CV- RRR , no murmur , no gallop  , no rub, nl s1 s2                           - JVD- none , edema- none, stasis changes- none, varices- none           Lung- clear to P&A, wheeze-none cough-none , dullness-none,            Chest wall- no rub Abd-  Br/ Gen/ Rectal- Not done, not indicated Extrem- cyanosis- none, clubbing, none, atrophy- none, strength- nl Neuro- grossly intact to observation

## 2015-02-11 ENCOUNTER — Other Ambulatory Visit: Payer: Self-pay

## 2015-02-11 DIAGNOSIS — Z1231 Encounter for screening mammogram for malignant neoplasm of breast: Secondary | ICD-10-CM

## 2015-03-11 ENCOUNTER — Ambulatory Visit
Admission: RE | Admit: 2015-03-11 | Discharge: 2015-03-11 | Disposition: A | Discharge status: Home / Self Care | Payer: 59 | Source: Ambulatory Visit

## 2015-03-11 DIAGNOSIS — Z1231 Encounter for screening mammogram for malignant neoplasm of breast: Secondary | ICD-10-CM

## 2015-05-20 DIAGNOSIS — J301 Allergic rhinitis due to pollen: Secondary | ICD-10-CM | POA: Diagnosis not present

## 2015-05-20 DIAGNOSIS — J3081 Allergic rhinitis due to animal (cat) (dog) hair and dander: Secondary | ICD-10-CM | POA: Diagnosis not present

## 2015-05-20 DIAGNOSIS — J3089 Other allergic rhinitis: Secondary | ICD-10-CM | POA: Diagnosis not present

## 2015-05-22 MED FILL — MOMETASONE FUROATE 50 MCG S: 50 | 30 days supply | Qty: 17 | Fill #3

## 2015-05-22 MED FILL — LEVOCETIRIZINE 5 MG TABLET: 5 | 30 days supply | Qty: 30 | Fill #0

## 2015-05-26 MED FILL — MONTELUKAST SOD 10 MG TAB: 10 | 30 days supply | Qty: 30 | Fill #4

## 2015-05-26 MED FILL — AMLODIPINE BESYLATE 10 MG T: 10 | 90 days supply | Qty: 90 | Fill #3

## 2015-05-26 MED FILL — TOPROL XL 100 MG TABLET SA: 100 | 30 days supply | Qty: 30 | Fill #9

## 2015-05-30 DIAGNOSIS — J3081 Allergic rhinitis due to animal (cat) (dog) hair and dander: Secondary | ICD-10-CM | POA: Diagnosis not present

## 2015-05-30 DIAGNOSIS — J301 Allergic rhinitis due to pollen: Secondary | ICD-10-CM | POA: Diagnosis not present

## 2015-05-30 DIAGNOSIS — J3089 Other allergic rhinitis: Secondary | ICD-10-CM | POA: Diagnosis not present

## 2015-06-11 DIAGNOSIS — J3081 Allergic rhinitis due to animal (cat) (dog) hair and dander: Secondary | ICD-10-CM | POA: Diagnosis not present

## 2015-06-11 DIAGNOSIS — J301 Allergic rhinitis due to pollen: Secondary | ICD-10-CM | POA: Diagnosis not present

## 2015-06-11 DIAGNOSIS — J3089 Other allergic rhinitis: Secondary | ICD-10-CM | POA: Diagnosis not present

## 2015-06-18 DIAGNOSIS — J3081 Allergic rhinitis due to animal (cat) (dog) hair and dander: Secondary | ICD-10-CM | POA: Diagnosis not present

## 2015-06-18 DIAGNOSIS — J3089 Other allergic rhinitis: Secondary | ICD-10-CM | POA: Diagnosis not present

## 2015-06-18 DIAGNOSIS — J301 Allergic rhinitis due to pollen: Secondary | ICD-10-CM | POA: Diagnosis not present

## 2015-06-19 MED FILL — LEVOCETIRIZINE 5 MG TABLET: 5 | 30 days supply | Qty: 30 | Fill #1

## 2015-06-19 MED FILL — LEVOTHYROXINE 50 MCG TABLET: 50 | 90 days supply | Qty: 90 | Fill #0

## 2015-06-23 MED FILL — MONTELUKAST SOD 10 MG TAB: 10 | 30 days supply | Qty: 30 | Fill #5

## 2015-06-23 MED FILL — BUPROPION HCL XL 300 MG TAB: 300 | 90 days supply | Qty: 90 | Fill #0

## 2015-06-23 MED FILL — TOPROL XL 100 MG TABLET SA: 100 | 30 days supply | Qty: 30 | Fill #10

## 2015-06-25 DIAGNOSIS — J3089 Other allergic rhinitis: Secondary | ICD-10-CM | POA: Diagnosis not present

## 2015-06-25 DIAGNOSIS — J3081 Allergic rhinitis due to animal (cat) (dog) hair and dander: Secondary | ICD-10-CM | POA: Diagnosis not present

## 2015-06-25 DIAGNOSIS — J301 Allergic rhinitis due to pollen: Secondary | ICD-10-CM | POA: Diagnosis not present

## 2015-07-02 DIAGNOSIS — J301 Allergic rhinitis due to pollen: Secondary | ICD-10-CM | POA: Diagnosis not present

## 2015-07-02 DIAGNOSIS — J3089 Other allergic rhinitis: Secondary | ICD-10-CM | POA: Diagnosis not present

## 2015-07-02 DIAGNOSIS — J3081 Allergic rhinitis due to animal (cat) (dog) hair and dander: Secondary | ICD-10-CM | POA: Diagnosis not present

## 2015-07-07 MED FILL — MOMETASONE FUROATE 50 MCG S: 50 | 30 days supply | Qty: 17 | Fill #4

## 2015-07-07 MED FILL — ADVAIR HFA 115-21 MCG INH: 115-21 | 30 days supply | Qty: 12 | Fill #3

## 2015-07-08 MED FILL — HYDROCHLOROTHIAZIDE 25 MG T: 25 | 90 days supply | Qty: 90 | Fill #0

## 2015-07-08 MED FILL — SIMVASTATIN 20 MG TABLET: 20 | 90 days supply | Qty: 90 | Fill #0

## 2015-07-09 DIAGNOSIS — J3089 Other allergic rhinitis: Secondary | ICD-10-CM | POA: Diagnosis not present

## 2015-07-09 DIAGNOSIS — J301 Allergic rhinitis due to pollen: Secondary | ICD-10-CM | POA: Diagnosis not present

## 2015-07-09 DIAGNOSIS — J3081 Allergic rhinitis due to animal (cat) (dog) hair and dander: Secondary | ICD-10-CM | POA: Diagnosis not present

## 2015-07-21 DIAGNOSIS — J301 Allergic rhinitis due to pollen: Secondary | ICD-10-CM | POA: Diagnosis not present

## 2015-07-21 DIAGNOSIS — R632 Polyphagia: Secondary | ICD-10-CM | POA: Diagnosis not present

## 2015-07-21 DIAGNOSIS — Z79899 Other long term (current) drug therapy: Secondary | ICD-10-CM | POA: Diagnosis not present

## 2015-07-21 DIAGNOSIS — M47816 Spondylosis without myelopathy or radiculopathy, lumbar region: Secondary | ICD-10-CM | POA: Diagnosis not present

## 2015-07-21 DIAGNOSIS — J3089 Other allergic rhinitis: Secondary | ICD-10-CM | POA: Diagnosis not present

## 2015-07-21 DIAGNOSIS — J3081 Allergic rhinitis due to animal (cat) (dog) hair and dander: Secondary | ICD-10-CM | POA: Diagnosis not present

## 2015-07-21 DIAGNOSIS — J45909 Unspecified asthma, uncomplicated: Secondary | ICD-10-CM | POA: Diagnosis not present

## 2015-07-21 DIAGNOSIS — E039 Hypothyroidism, unspecified: Secondary | ICD-10-CM | POA: Diagnosis not present

## 2015-07-21 DIAGNOSIS — Z0000 Encounter for general adult medical examination without abnormal findings: Secondary | ICD-10-CM | POA: Diagnosis not present

## 2015-07-21 DIAGNOSIS — E559 Vitamin D deficiency, unspecified: Secondary | ICD-10-CM | POA: Diagnosis not present

## 2015-07-21 DIAGNOSIS — E7800 Pure hypercholesterolemia, unspecified: Secondary | ICD-10-CM | POA: Diagnosis not present

## 2015-07-21 DIAGNOSIS — I10 Essential (primary) hypertension: Secondary | ICD-10-CM | POA: Diagnosis not present

## 2015-07-21 MED FILL — METOPROLOL TARTRATE 50 MG T: 50 | 30 days supply | Qty: 60 | Fill #0

## 2015-07-22 MED FILL — SERTRALINE HCL 50 MG TABLET: 50 | 90 days supply | Qty: 90 | Fill #0

## 2015-07-22 MED FILL — MONTELUKAST SOD 10 MG TAB: 10 | 30 days supply | Qty: 30 | Fill #0

## 2015-07-22 MED FILL — LEVOCETIRIZINE 5 MG TABLET: 5 | 30 days supply | Qty: 30 | Fill #0

## 2015-07-30 DIAGNOSIS — J301 Allergic rhinitis due to pollen: Secondary | ICD-10-CM | POA: Diagnosis not present

## 2015-07-30 DIAGNOSIS — J3089 Other allergic rhinitis: Secondary | ICD-10-CM | POA: Diagnosis not present

## 2015-07-30 DIAGNOSIS — J4540 Moderate persistent asthma, uncomplicated: Secondary | ICD-10-CM | POA: Diagnosis not present

## 2015-07-30 DIAGNOSIS — J3081 Allergic rhinitis due to animal (cat) (dog) hair and dander: Secondary | ICD-10-CM | POA: Diagnosis not present

## 2015-07-30 DIAGNOSIS — R21 Rash and other nonspecific skin eruption: Secondary | ICD-10-CM | POA: Diagnosis not present

## 2015-07-30 DIAGNOSIS — L501 Idiopathic urticaria: Secondary | ICD-10-CM | POA: Diagnosis not present

## 2015-08-05 DIAGNOSIS — J3081 Allergic rhinitis due to animal (cat) (dog) hair and dander: Secondary | ICD-10-CM | POA: Diagnosis not present

## 2015-08-05 DIAGNOSIS — J3089 Other allergic rhinitis: Secondary | ICD-10-CM | POA: Diagnosis not present

## 2015-08-05 DIAGNOSIS — J301 Allergic rhinitis due to pollen: Secondary | ICD-10-CM | POA: Diagnosis not present

## 2015-08-12 DIAGNOSIS — J3089 Other allergic rhinitis: Secondary | ICD-10-CM | POA: Diagnosis not present

## 2015-08-12 DIAGNOSIS — J301 Allergic rhinitis due to pollen: Secondary | ICD-10-CM | POA: Diagnosis not present

## 2015-08-12 DIAGNOSIS — J3081 Allergic rhinitis due to animal (cat) (dog) hair and dander: Secondary | ICD-10-CM | POA: Diagnosis not present

## 2015-08-19 MED FILL — LEVOCETIRIZINE 5 MG TABLET: 5 | 30 days supply | Qty: 30 | Fill #0

## 2015-08-19 MED FILL — METOPROLOL TARTRATE 50 MG T: 50 | 30 days supply | Qty: 60 | Fill #1

## 2015-08-19 MED FILL — MONTELUKAST SOD 10 MG TAB: 10 | 30 days supply | Qty: 30 | Fill #0

## 2015-08-26 MED FILL — AMLODIPINE BESYLATE 10 MG T: 10 | 90 days supply | Qty: 90 | Fill #0

## 2015-08-27 MED FILL — ADVAIR HFA 115-21 MCG INH: 115-21 | 30 days supply | Qty: 12 | Fill #0

## 2015-08-27 MED FILL — MOMETASONE FUROATE 50 MCG S: 50 | 30 days supply | Qty: 17 | Fill #0

## 2015-09-01 DIAGNOSIS — J3089 Other allergic rhinitis: Secondary | ICD-10-CM | POA: Diagnosis not present

## 2015-09-01 DIAGNOSIS — J301 Allergic rhinitis due to pollen: Secondary | ICD-10-CM | POA: Diagnosis not present

## 2015-09-01 DIAGNOSIS — J3081 Allergic rhinitis due to animal (cat) (dog) hair and dander: Secondary | ICD-10-CM | POA: Diagnosis not present

## 2015-09-10 DIAGNOSIS — J3089 Other allergic rhinitis: Secondary | ICD-10-CM | POA: Diagnosis not present

## 2015-09-10 DIAGNOSIS — J301 Allergic rhinitis due to pollen: Secondary | ICD-10-CM | POA: Diagnosis not present

## 2015-09-10 DIAGNOSIS — J3081 Allergic rhinitis due to animal (cat) (dog) hair and dander: Secondary | ICD-10-CM | POA: Diagnosis not present

## 2015-09-17 MED FILL — METOPROLOL TARTRATE 50 MG T: 50 | 30 days supply | Qty: 60 | Fill #2

## 2015-09-17 MED FILL — LEVOCETIRIZINE 5 MG TABLET: 5 | 30 days supply | Qty: 30 | Fill #1

## 2015-09-17 MED FILL — BUPROPION HCL XL 300 MG TAB: 300 | 90 days supply | Qty: 90 | Fill #0

## 2015-09-17 MED FILL — LEVOTHYROXINE 50 MCG TABLET: 50 | 90 days supply | Qty: 90 | Fill #0

## 2015-09-19 DIAGNOSIS — J3089 Other allergic rhinitis: Secondary | ICD-10-CM | POA: Diagnosis not present

## 2015-09-19 DIAGNOSIS — J301 Allergic rhinitis due to pollen: Secondary | ICD-10-CM | POA: Diagnosis not present

## 2015-09-19 DIAGNOSIS — J3081 Allergic rhinitis due to animal (cat) (dog) hair and dander: Secondary | ICD-10-CM | POA: Diagnosis not present

## 2015-09-24 DIAGNOSIS — J3081 Allergic rhinitis due to animal (cat) (dog) hair and dander: Secondary | ICD-10-CM | POA: Diagnosis not present

## 2015-09-24 DIAGNOSIS — J301 Allergic rhinitis due to pollen: Secondary | ICD-10-CM | POA: Diagnosis not present

## 2015-09-24 DIAGNOSIS — J3089 Other allergic rhinitis: Secondary | ICD-10-CM | POA: Diagnosis not present

## 2015-10-01 MED FILL — MONTELUKAST SOD 10 MG TAB: 10 | 30 days supply | Qty: 30 | Fill #1

## 2015-10-02 DIAGNOSIS — J3081 Allergic rhinitis due to animal (cat) (dog) hair and dander: Secondary | ICD-10-CM | POA: Diagnosis not present

## 2015-10-02 DIAGNOSIS — J3089 Other allergic rhinitis: Secondary | ICD-10-CM | POA: Diagnosis not present

## 2015-10-02 DIAGNOSIS — J301 Allergic rhinitis due to pollen: Secondary | ICD-10-CM | POA: Diagnosis not present

## 2015-10-02 MED FILL — HYDROCHLOROTHIAZIDE 25 MG T: 25 | 90 days supply | Qty: 90 | Fill #0

## 2015-10-02 MED FILL — SIMVASTATIN 20 MG TABLET: 20 | 90 days supply | Qty: 90 | Fill #0

## 2015-10-07 DIAGNOSIS — J3089 Other allergic rhinitis: Secondary | ICD-10-CM | POA: Diagnosis not present

## 2015-10-07 DIAGNOSIS — J3081 Allergic rhinitis due to animal (cat) (dog) hair and dander: Secondary | ICD-10-CM | POA: Diagnosis not present

## 2015-10-07 DIAGNOSIS — J301 Allergic rhinitis due to pollen: Secondary | ICD-10-CM | POA: Diagnosis not present

## 2015-10-07 MED FILL — ADVAIR HFA 115-21 MCG INH: 115-21 | 30 days supply | Qty: 12 | Fill #1

## 2015-10-07 MED FILL — MOMETASONE FUROATE 50 MCG S: 50 | 30 days supply | Qty: 17 | Fill #1

## 2015-10-09 DIAGNOSIS — J301 Allergic rhinitis due to pollen: Secondary | ICD-10-CM | POA: Diagnosis not present

## 2015-10-09 DIAGNOSIS — J3081 Allergic rhinitis due to animal (cat) (dog) hair and dander: Secondary | ICD-10-CM | POA: Diagnosis not present

## 2015-10-09 DIAGNOSIS — J3089 Other allergic rhinitis: Secondary | ICD-10-CM | POA: Diagnosis not present

## 2015-10-15 DIAGNOSIS — J3089 Other allergic rhinitis: Secondary | ICD-10-CM | POA: Diagnosis not present

## 2015-10-15 DIAGNOSIS — J3081 Allergic rhinitis due to animal (cat) (dog) hair and dander: Secondary | ICD-10-CM | POA: Diagnosis not present

## 2015-10-15 DIAGNOSIS — J301 Allergic rhinitis due to pollen: Secondary | ICD-10-CM | POA: Diagnosis not present

## 2015-10-15 MED FILL — LEVOCETIRIZINE 5 MG TABLET: 5 | 30 days supply | Qty: 30 | Fill #2

## 2015-10-15 MED FILL — METOPROLOL TARTRATE 50 MG T: 50 | 30 days supply | Qty: 60 | Fill #3

## 2015-10-16 MED FILL — SERTRALINE HCL 50 MG TABLET: 50 | 90 days supply | Qty: 90 | Fill #1

## 2015-10-17 DIAGNOSIS — J301 Allergic rhinitis due to pollen: Secondary | ICD-10-CM | POA: Diagnosis not present

## 2015-10-17 DIAGNOSIS — J3089 Other allergic rhinitis: Secondary | ICD-10-CM | POA: Diagnosis not present

## 2015-10-17 DIAGNOSIS — J3081 Allergic rhinitis due to animal (cat) (dog) hair and dander: Secondary | ICD-10-CM | POA: Diagnosis not present

## 2015-10-24 DIAGNOSIS — J3089 Other allergic rhinitis: Secondary | ICD-10-CM | POA: Diagnosis not present

## 2015-10-24 DIAGNOSIS — J301 Allergic rhinitis due to pollen: Secondary | ICD-10-CM | POA: Diagnosis not present

## 2015-10-24 DIAGNOSIS — J3081 Allergic rhinitis due to animal (cat) (dog) hair and dander: Secondary | ICD-10-CM | POA: Diagnosis not present

## 2015-10-30 DIAGNOSIS — J301 Allergic rhinitis due to pollen: Secondary | ICD-10-CM | POA: Diagnosis not present

## 2015-10-30 DIAGNOSIS — J3089 Other allergic rhinitis: Secondary | ICD-10-CM | POA: Diagnosis not present

## 2015-10-30 DIAGNOSIS — J3081 Allergic rhinitis due to animal (cat) (dog) hair and dander: Secondary | ICD-10-CM | POA: Diagnosis not present

## 2015-11-03 DIAGNOSIS — L309 Dermatitis, unspecified: Secondary | ICD-10-CM | POA: Diagnosis not present

## 2015-11-05 DIAGNOSIS — J3089 Other allergic rhinitis: Secondary | ICD-10-CM | POA: Diagnosis not present

## 2015-11-05 DIAGNOSIS — J3081 Allergic rhinitis due to animal (cat) (dog) hair and dander: Secondary | ICD-10-CM | POA: Diagnosis not present

## 2015-11-05 DIAGNOSIS — J301 Allergic rhinitis due to pollen: Secondary | ICD-10-CM | POA: Diagnosis not present

## 2015-11-12 DIAGNOSIS — J301 Allergic rhinitis due to pollen: Secondary | ICD-10-CM | POA: Diagnosis not present

## 2015-11-12 DIAGNOSIS — J3089 Other allergic rhinitis: Secondary | ICD-10-CM | POA: Diagnosis not present

## 2015-11-12 DIAGNOSIS — J3081 Allergic rhinitis due to animal (cat) (dog) hair and dander: Secondary | ICD-10-CM | POA: Diagnosis not present

## 2015-11-13 MED FILL — METOPROLOL TARTRATE 50 MG T: 50 | 30 days supply | Qty: 60 | Fill #4

## 2015-11-13 MED FILL — LEVOCETIRIZINE 5 MG TABLET: 5 | 30 days supply | Qty: 30 | Fill #3

## 2015-11-13 MED FILL — MONTELUKAST SOD 10 MG TAB: 10 | 30 days supply | Qty: 30 | Fill #2

## 2015-11-14 DIAGNOSIS — J301 Allergic rhinitis due to pollen: Secondary | ICD-10-CM | POA: Diagnosis not present

## 2015-11-14 DIAGNOSIS — J3089 Other allergic rhinitis: Secondary | ICD-10-CM | POA: Diagnosis not present

## 2015-11-14 DIAGNOSIS — J3081 Allergic rhinitis due to animal (cat) (dog) hair and dander: Secondary | ICD-10-CM | POA: Diagnosis not present

## 2015-11-19 DIAGNOSIS — J3081 Allergic rhinitis due to animal (cat) (dog) hair and dander: Secondary | ICD-10-CM | POA: Diagnosis not present

## 2015-11-19 DIAGNOSIS — J301 Allergic rhinitis due to pollen: Secondary | ICD-10-CM | POA: Diagnosis not present

## 2015-11-19 DIAGNOSIS — J3089 Other allergic rhinitis: Secondary | ICD-10-CM | POA: Diagnosis not present

## 2015-11-24 MED FILL — AMLODIPINE BESYLATE 10 MG T: 10 | 90 days supply | Qty: 90 | Fill #1

## 2015-11-26 DIAGNOSIS — J3081 Allergic rhinitis due to animal (cat) (dog) hair and dander: Secondary | ICD-10-CM | POA: Diagnosis not present

## 2015-11-26 DIAGNOSIS — J3089 Other allergic rhinitis: Secondary | ICD-10-CM | POA: Diagnosis not present

## 2015-11-26 DIAGNOSIS — J301 Allergic rhinitis due to pollen: Secondary | ICD-10-CM | POA: Diagnosis not present

## 2015-12-03 DIAGNOSIS — J3081 Allergic rhinitis due to animal (cat) (dog) hair and dander: Secondary | ICD-10-CM | POA: Diagnosis not present

## 2015-12-03 DIAGNOSIS — J301 Allergic rhinitis due to pollen: Secondary | ICD-10-CM | POA: Diagnosis not present

## 2015-12-03 DIAGNOSIS — J3089 Other allergic rhinitis: Secondary | ICD-10-CM | POA: Diagnosis not present

## 2015-12-17 DIAGNOSIS — J3081 Allergic rhinitis due to animal (cat) (dog) hair and dander: Secondary | ICD-10-CM | POA: Diagnosis not present

## 2015-12-17 DIAGNOSIS — J301 Allergic rhinitis due to pollen: Secondary | ICD-10-CM | POA: Diagnosis not present

## 2015-12-17 DIAGNOSIS — J3089 Other allergic rhinitis: Secondary | ICD-10-CM | POA: Diagnosis not present

## 2015-12-18 MED FILL — BUPROPION HCL XL 300 MG TAB: 300 | 90 days supply | Qty: 90 | Fill #1

## 2015-12-18 MED FILL — LEVOCETIRIZINE 5 MG TABLET: 5 | 30 days supply | Qty: 30 | Fill #4

## 2015-12-18 MED FILL — MONTELUKAST SOD 10 MG TAB: 10 | 30 days supply | Qty: 30 | Fill #3

## 2015-12-18 MED FILL — LEVOTHYROXINE 50 MCG TABLET: 50 | 90 days supply | Qty: 90 | Fill #1

## 2015-12-18 MED FILL — METOPROLOL TARTRATE 50 MG T: 50 | 30 days supply | Qty: 60 | Fill #5

## 2015-12-19 MED FILL — MOMETASONE FUROATE 50 MCG S: 50 | 30 days supply | Qty: 17 | Fill #2

## 2015-12-24 DIAGNOSIS — J3089 Other allergic rhinitis: Secondary | ICD-10-CM | POA: Diagnosis not present

## 2015-12-24 DIAGNOSIS — J3081 Allergic rhinitis due to animal (cat) (dog) hair and dander: Secondary | ICD-10-CM | POA: Diagnosis not present

## 2015-12-24 DIAGNOSIS — J301 Allergic rhinitis due to pollen: Secondary | ICD-10-CM | POA: Diagnosis not present

## 2015-12-31 DIAGNOSIS — J3081 Allergic rhinitis due to animal (cat) (dog) hair and dander: Secondary | ICD-10-CM | POA: Diagnosis not present

## 2015-12-31 DIAGNOSIS — J301 Allergic rhinitis due to pollen: Secondary | ICD-10-CM | POA: Diagnosis not present

## 2015-12-31 DIAGNOSIS — J3089 Other allergic rhinitis: Secondary | ICD-10-CM | POA: Diagnosis not present

## 2016-01-02 MED FILL — HYDROCHLOROTHIAZIDE 25 MG T: 25 | 90 days supply | Qty: 90 | Fill #1

## 2016-01-02 MED FILL — SIMVASTATIN 20 MG TABLET: 20 | 90 days supply | Qty: 90 | Fill #1

## 2016-01-12 DIAGNOSIS — J301 Allergic rhinitis due to pollen: Secondary | ICD-10-CM | POA: Diagnosis not present

## 2016-01-12 DIAGNOSIS — J3089 Other allergic rhinitis: Secondary | ICD-10-CM | POA: Diagnosis not present

## 2016-01-12 DIAGNOSIS — J3081 Allergic rhinitis due to animal (cat) (dog) hair and dander: Secondary | ICD-10-CM | POA: Diagnosis not present

## 2016-01-15 DIAGNOSIS — J301 Allergic rhinitis due to pollen: Secondary | ICD-10-CM | POA: Diagnosis not present

## 2016-01-15 DIAGNOSIS — J3089 Other allergic rhinitis: Secondary | ICD-10-CM | POA: Diagnosis not present

## 2016-01-15 DIAGNOSIS — J3081 Allergic rhinitis due to animal (cat) (dog) hair and dander: Secondary | ICD-10-CM | POA: Diagnosis not present

## 2016-01-15 MED FILL — MONTELUKAST SOD 10 MG TAB: 10 | 30 days supply | Qty: 30 | Fill #4

## 2016-01-15 MED FILL — SERTRALINE HCL 50 MG TABLET: 50 | 90 days supply | Qty: 90 | Fill #2

## 2016-01-15 MED FILL — METOPROLOL TARTRATE 50 MG T: 50 | 30 days supply | Qty: 60 | Fill #6

## 2016-01-15 MED FILL — LEVOCETIRIZINE 5 MG TABLET: 5 | 30 days supply | Qty: 30 | Fill #0

## 2016-02-10 DIAGNOSIS — J3089 Other allergic rhinitis: Secondary | ICD-10-CM | POA: Diagnosis not present

## 2016-02-10 DIAGNOSIS — J301 Allergic rhinitis due to pollen: Secondary | ICD-10-CM | POA: Diagnosis not present

## 2016-02-10 DIAGNOSIS — J3081 Allergic rhinitis due to animal (cat) (dog) hair and dander: Secondary | ICD-10-CM | POA: Diagnosis not present

## 2016-02-13 DIAGNOSIS — J3081 Allergic rhinitis due to animal (cat) (dog) hair and dander: Secondary | ICD-10-CM | POA: Diagnosis not present

## 2016-02-13 DIAGNOSIS — J3089 Other allergic rhinitis: Secondary | ICD-10-CM | POA: Diagnosis not present

## 2016-02-13 DIAGNOSIS — J301 Allergic rhinitis due to pollen: Secondary | ICD-10-CM | POA: Diagnosis not present

## 2016-02-13 MED FILL — AMLODIPINE BESYLATE 10 MG T: 10 | 30 days supply | Qty: 30 | Fill #2

## 2016-02-13 MED FILL — LEVOCETIRIZINE 5 MG TABLET: 5 | 30 days supply | Qty: 30 | Fill #1

## 2016-02-13 MED FILL — MONTELUKAST SOD 10 MG TAB: 10 | 30 days supply | Qty: 30 | Fill #0

## 2016-02-13 MED FILL — METOPROLOL TARTRATE 50 MG T: 50 | 30 days supply | Qty: 60 | Fill #7

## 2016-02-23 DIAGNOSIS — J3089 Other allergic rhinitis: Secondary | ICD-10-CM | POA: Diagnosis not present

## 2016-02-23 DIAGNOSIS — J301 Allergic rhinitis due to pollen: Secondary | ICD-10-CM | POA: Diagnosis not present

## 2016-02-23 DIAGNOSIS — J3081 Allergic rhinitis due to animal (cat) (dog) hair and dander: Secondary | ICD-10-CM | POA: Diagnosis not present

## 2016-02-27 DIAGNOSIS — J301 Allergic rhinitis due to pollen: Secondary | ICD-10-CM | POA: Diagnosis not present

## 2016-02-27 DIAGNOSIS — J3081 Allergic rhinitis due to animal (cat) (dog) hair and dander: Secondary | ICD-10-CM | POA: Diagnosis not present

## 2016-02-27 DIAGNOSIS — J3089 Other allergic rhinitis: Secondary | ICD-10-CM | POA: Diagnosis not present

## 2016-03-03 DIAGNOSIS — J301 Allergic rhinitis due to pollen: Secondary | ICD-10-CM | POA: Diagnosis not present

## 2016-03-03 DIAGNOSIS — J3081 Allergic rhinitis due to animal (cat) (dog) hair and dander: Secondary | ICD-10-CM | POA: Diagnosis not present

## 2016-03-03 DIAGNOSIS — J3089 Other allergic rhinitis: Secondary | ICD-10-CM | POA: Diagnosis not present

## 2016-03-09 DIAGNOSIS — J301 Allergic rhinitis due to pollen: Secondary | ICD-10-CM | POA: Diagnosis not present

## 2016-03-09 DIAGNOSIS — J3081 Allergic rhinitis due to animal (cat) (dog) hair and dander: Secondary | ICD-10-CM | POA: Diagnosis not present

## 2016-03-09 DIAGNOSIS — J3089 Other allergic rhinitis: Secondary | ICD-10-CM | POA: Diagnosis not present

## 2016-03-15 MED FILL — BUPROPION HCL XL 300 MG TAB: 300 | 90 days supply | Qty: 90 | Fill #2

## 2016-03-15 MED FILL — LEVOCETIRIZINE 5 MG TABLET: 5 | 30 days supply | Qty: 30 | Fill #2

## 2016-03-15 MED FILL — METOPROLOL TARTRATE 50 MG T: 50 | 30 days supply | Qty: 60 | Fill #8

## 2016-03-15 MED FILL — LEVOTHYROXINE 50 MCG TABLET: 50 | 90 days supply | Qty: 90 | Fill #2

## 2016-03-16 MED FILL — ADVAIR HFA 115-21 MCG INH: 115-21 | 30 days supply | Qty: 12 | Fill #2

## 2016-03-18 DIAGNOSIS — J3089 Other allergic rhinitis: Secondary | ICD-10-CM | POA: Diagnosis not present

## 2016-03-18 DIAGNOSIS — J301 Allergic rhinitis due to pollen: Secondary | ICD-10-CM | POA: Diagnosis not present

## 2016-03-18 DIAGNOSIS — J3081 Allergic rhinitis due to animal (cat) (dog) hair and dander: Secondary | ICD-10-CM | POA: Diagnosis not present

## 2016-03-23 MED FILL — AMLODIPINE BESYLATE 10 MG T: 10 | 90 days supply | Qty: 90 | Fill #3

## 2016-03-23 MED FILL — MONTELUKAST SOD 10 MG TAB: 10 | 30 days supply | Qty: 30 | Fill #1

## 2016-03-25 DIAGNOSIS — J3089 Other allergic rhinitis: Secondary | ICD-10-CM | POA: Diagnosis not present

## 2016-03-25 DIAGNOSIS — J301 Allergic rhinitis due to pollen: Secondary | ICD-10-CM | POA: Diagnosis not present

## 2016-03-25 DIAGNOSIS — J3081 Allergic rhinitis due to animal (cat) (dog) hair and dander: Secondary | ICD-10-CM | POA: Diagnosis not present

## 2016-03-31 DIAGNOSIS — J3081 Allergic rhinitis due to animal (cat) (dog) hair and dander: Secondary | ICD-10-CM | POA: Diagnosis not present

## 2016-03-31 DIAGNOSIS — J301 Allergic rhinitis due to pollen: Secondary | ICD-10-CM | POA: Diagnosis not present

## 2016-03-31 DIAGNOSIS — J3089 Other allergic rhinitis: Secondary | ICD-10-CM | POA: Diagnosis not present

## 2016-03-31 MED FILL — SIMVASTATIN 20 MG TABLET: 20 | 90 days supply | Qty: 90 | Fill #2

## 2016-04-06 MED FILL — HYDROCHLOROTHIAZIDE 25 MG T: 25 | 90 days supply | Qty: 90 | Fill #2

## 2016-04-06 MED FILL — METOPROLOL TARTRATE 50 MG T: 50 | 30 days supply | Qty: 60 | Fill #9

## 2016-04-07 DIAGNOSIS — J3081 Allergic rhinitis due to animal (cat) (dog) hair and dander: Secondary | ICD-10-CM | POA: Diagnosis not present

## 2016-04-07 DIAGNOSIS — J301 Allergic rhinitis due to pollen: Secondary | ICD-10-CM | POA: Diagnosis not present

## 2016-04-07 DIAGNOSIS — J3089 Other allergic rhinitis: Secondary | ICD-10-CM | POA: Diagnosis not present

## 2016-04-14 DIAGNOSIS — J3089 Other allergic rhinitis: Secondary | ICD-10-CM | POA: Diagnosis not present

## 2016-04-14 DIAGNOSIS — J301 Allergic rhinitis due to pollen: Secondary | ICD-10-CM | POA: Diagnosis not present

## 2016-04-14 DIAGNOSIS — J3081 Allergic rhinitis due to animal (cat) (dog) hair and dander: Secondary | ICD-10-CM | POA: Diagnosis not present

## 2016-04-19 MED FILL — MONTELUKAST SOD 10 MG TAB: 10 | 30 days supply | Qty: 30 | Fill #2

## 2016-04-19 MED FILL — MOMETASONE FUROATE 50 MCG S: 50 | 30 days supply | Qty: 17 | Fill #3

## 2016-04-19 MED FILL — SERTRALINE HCL 50 MG TABLET: 50 | 90 days supply | Qty: 90 | Fill #3

## 2016-04-21 DIAGNOSIS — J3081 Allergic rhinitis due to animal (cat) (dog) hair and dander: Secondary | ICD-10-CM | POA: Diagnosis not present

## 2016-04-21 DIAGNOSIS — J3089 Other allergic rhinitis: Secondary | ICD-10-CM | POA: Diagnosis not present

## 2016-04-21 DIAGNOSIS — J301 Allergic rhinitis due to pollen: Secondary | ICD-10-CM | POA: Diagnosis not present

## 2016-04-28 DIAGNOSIS — J301 Allergic rhinitis due to pollen: Secondary | ICD-10-CM | POA: Diagnosis not present

## 2016-04-28 DIAGNOSIS — J3081 Allergic rhinitis due to animal (cat) (dog) hair and dander: Secondary | ICD-10-CM | POA: Diagnosis not present

## 2016-04-28 DIAGNOSIS — J3089 Other allergic rhinitis: Secondary | ICD-10-CM | POA: Diagnosis not present

## 2016-05-05 DIAGNOSIS — J301 Allergic rhinitis due to pollen: Secondary | ICD-10-CM | POA: Diagnosis not present

## 2016-05-05 DIAGNOSIS — J3089 Other allergic rhinitis: Secondary | ICD-10-CM | POA: Diagnosis not present

## 2016-05-05 DIAGNOSIS — J3081 Allergic rhinitis due to animal (cat) (dog) hair and dander: Secondary | ICD-10-CM | POA: Diagnosis not present

## 2016-05-05 DIAGNOSIS — L309 Dermatitis, unspecified: Secondary | ICD-10-CM | POA: Diagnosis not present

## 2016-05-05 MED FILL — predniSONE 20 MG TABS: 20 | 9 days supply | Qty: 18 | Fill #0

## 2016-05-05 MED FILL — METOPROLOL TARTRATE 50 MG T: 50 | 30 days supply | Qty: 60 | Fill #10

## 2016-05-13 DIAGNOSIS — J3081 Allergic rhinitis due to animal (cat) (dog) hair and dander: Secondary | ICD-10-CM | POA: Diagnosis not present

## 2016-05-13 DIAGNOSIS — J3089 Other allergic rhinitis: Secondary | ICD-10-CM | POA: Diagnosis not present

## 2016-05-13 DIAGNOSIS — J301 Allergic rhinitis due to pollen: Secondary | ICD-10-CM | POA: Diagnosis not present

## 2016-05-19 DIAGNOSIS — J3081 Allergic rhinitis due to animal (cat) (dog) hair and dander: Secondary | ICD-10-CM | POA: Diagnosis not present

## 2016-05-19 DIAGNOSIS — J3089 Other allergic rhinitis: Secondary | ICD-10-CM | POA: Diagnosis not present

## 2016-05-19 DIAGNOSIS — J301 Allergic rhinitis due to pollen: Secondary | ICD-10-CM | POA: Diagnosis not present

## 2016-05-26 DIAGNOSIS — R05 Cough: Secondary | ICD-10-CM | POA: Diagnosis not present

## 2016-05-26 DIAGNOSIS — R21 Rash and other nonspecific skin eruption: Secondary | ICD-10-CM | POA: Diagnosis not present

## 2016-05-26 DIAGNOSIS — J301 Allergic rhinitis due to pollen: Secondary | ICD-10-CM | POA: Diagnosis not present

## 2016-05-26 DIAGNOSIS — J4540 Moderate persistent asthma, uncomplicated: Secondary | ICD-10-CM | POA: Diagnosis not present

## 2016-05-26 MED FILL — LEVOCETIRIZINE 5 MG TABLET: 5 | 30 days supply | Qty: 30 | Fill #0

## 2016-05-26 MED FILL — MONTELUKAST SOD 10 MG TAB: 10 | 30 days supply | Qty: 30 | Fill #0

## 2016-05-26 MED FILL — AZITHROMYCIN 250 MG TABLET: 250 | 5 days supply | Qty: 6 | Fill #0

## 2016-06-07 MED FILL — METOPROLOL TARTRATE 50 MG T: 50 | 30 days supply | Qty: 60 | Fill #11

## 2016-06-07 MED FILL — BUPROPION HCL XL 300 MG TAB: 300 | 90 days supply | Qty: 90 | Fill #3

## 2016-06-07 MED FILL — BREO ELLIPTA 200-25 MCG INH: 200-25 | 30 days supply | Qty: 60 | Fill #0

## 2016-06-09 DIAGNOSIS — J301 Allergic rhinitis due to pollen: Secondary | ICD-10-CM | POA: Diagnosis not present

## 2016-06-09 DIAGNOSIS — J3081 Allergic rhinitis due to animal (cat) (dog) hair and dander: Secondary | ICD-10-CM | POA: Diagnosis not present

## 2016-06-09 DIAGNOSIS — J3089 Other allergic rhinitis: Secondary | ICD-10-CM | POA: Diagnosis not present

## 2016-06-16 DIAGNOSIS — J301 Allergic rhinitis due to pollen: Secondary | ICD-10-CM | POA: Diagnosis not present

## 2016-06-16 DIAGNOSIS — J3089 Other allergic rhinitis: Secondary | ICD-10-CM | POA: Diagnosis not present

## 2016-06-16 DIAGNOSIS — J3081 Allergic rhinitis due to animal (cat) (dog) hair and dander: Secondary | ICD-10-CM | POA: Diagnosis not present

## 2016-06-16 MED FILL — AMLODIPINE BESYLATE 10 MG T: 10 | 90 days supply | Qty: 90 | Fill #0

## 2016-06-16 MED FILL — LEVOTHYROXINE 50 MCG TABLET: 50 | 90 days supply | Qty: 90 | Fill #3

## 2016-07-05 MED FILL — MOMETASONE FUROATE 50 MCG S: 50 | 30 days supply | Qty: 17 | Fill #4

## 2016-07-05 MED FILL — MONTELUKAST SOD 10 MG TAB: 10 | 30 days supply | Qty: 30 | Fill #1

## 2016-07-05 MED FILL — SIMVASTATIN 20 MG TABLET: 20 | 90 days supply | Qty: 90 | Fill #3

## 2016-07-05 MED FILL — HYDROCHLOROTHIAZIDE 25 MG T: 25 | 90 days supply | Qty: 90 | Fill #3

## 2016-07-05 MED FILL — METOPROLOL TARTRATE 50 MG T: 50 | 30 days supply | Qty: 60 | Fill #12

## 2016-07-15 MED FILL — SERTRALINE HCL 50 MG TABLET: 50 | 90 days supply | Qty: 90 | Fill #0

## 2016-07-15 MED FILL — BREO ELLIPTA 200-25 MCG INH: 200-25 | 30 days supply | Qty: 60 | Fill #1

## 2016-08-02 MED FILL — MONTELUKAST SOD 10 MG TAB: 10 | 30 days supply | Qty: 30 | Fill #2

## 2016-08-06 MED FILL — METOPROLOL TARTRATE 50 MG T: 50 | 30 days supply | Qty: 60 | Fill #0

## 2016-08-09 MED FILL — LEVOCETIRIZINE 5 MG TABLET: 5 | 30 days supply | Qty: 30 | Fill #1

## 2016-08-10 ENCOUNTER — Other Ambulatory Visit: Payer: Self-pay | Admitting: Family Medicine

## 2016-08-10 ENCOUNTER — Other Ambulatory Visit (HOSPITAL_COMMUNITY)
Admission: RE | Admit: 2016-08-10 | Discharge: 2016-08-10 | Disposition: A | Discharge status: Home / Self Care | Payer: PRIVATE HEALTH INSURANCE | Source: Ambulatory Visit | Attending: Family Medicine | Admitting: Family Medicine

## 2016-08-10 DIAGNOSIS — Z01411 Encounter for gynecological examination (general) (routine) with abnormal findings: Secondary | ICD-10-CM | POA: Insufficient documentation

## 2016-08-10 DIAGNOSIS — Z1151 Encounter for screening for human papillomavirus (HPV): Secondary | ICD-10-CM | POA: Diagnosis present

## 2016-08-10 MED FILL — TRIAMCINOLONE 0.5% CREAM: 0.5 | 30 days supply | Qty: 60 | Fill #0

## 2016-08-13 LAB — CYTOLOGY - PAP
Diagnosis: NEGATIVE
HPV: NOT DETECTED

## 2016-09-03 MED FILL — MONTELUKAST SOD 10 MG TAB: 10 | 30 days supply | Qty: 30 | Fill #3

## 2016-09-03 MED FILL — BREO ELLIPTA 200-25 MCG INH: 200-25 | 30 days supply | Qty: 60 | Fill #2

## 2016-09-03 MED FILL — METOPROLOL TARTRATE 50 MG T: 50 | 30 days supply | Qty: 60 | Fill #0

## 2016-09-13 MED FILL — BUPROPION HCL XL 300 MG TAB: 300 | 30 days supply | Qty: 30 | Fill #0

## 2016-09-13 MED FILL — LEVOCETIRIZINE 5 MG TABLET: 5 | 30 days supply | Qty: 30 | Fill #2

## 2016-09-13 MED FILL — AMLODIPINE BESYLATE 10 MG T: 10 | 30 days supply | Qty: 30 | Fill #1

## 2016-09-16 MED FILL — LEVOTHYROXINE 50 MCG TABLET: 50 | 30 days supply | Qty: 30 | Fill #0

## 2016-10-03 MED FILL — MONTELUKAST SOD 10 MG TAB: 10 | 30 days supply | Qty: 30 | Fill #4

## 2016-10-03 MED FILL — METOPROLOL TARTRATE 50 MG T: 50 | 30 days supply | Qty: 60 | Fill #1

## 2016-10-05 MED FILL — HYDROCHLOROTHIAZIDE 25 MG T: 25 | 90 days supply | Qty: 90 | Fill #0

## 2016-10-05 MED FILL — SIMVASTATIN 20 MG TABLET: 20 | 90 days supply | Qty: 90 | Fill #0

## 2016-10-10 MED FILL — LEVOCETIRIZINE 5 MG TABLET: 5 | 30 days supply | Qty: 30 | Fill #3

## 2016-10-10 MED FILL — BUPROPION HCL XL 300 MG TAB: 300 | 30 days supply | Qty: 30 | Fill #1

## 2016-10-10 MED FILL — AMLODIPINE BESYLATE 10 MG T: 10 | 30 days supply | Qty: 30 | Fill #2

## 2016-10-12 MED FILL — LEVOTHYROXINE 50 MCG TABLET: 50 | 30 days supply | Qty: 30 | Fill #1

## 2016-10-12 MED FILL — SERTRALINE HCL 50 MG TABLET: 50 | 30 days supply | Qty: 30 | Fill #1

## 2016-11-03 MED FILL — BREO ELLIPTA 200-25 MCG INH: 200-25 | 30 days supply | Qty: 60 | Fill #3

## 2016-11-03 MED FILL — METOPROLOL TARTRATE 50 MG T: 50 | 30 days supply | Qty: 60 | Fill #2

## 2016-11-04 MED FILL — MOMETASONE FUROATE 50 MCG S: 50 | 30 days supply | Qty: 17 | Fill #0

## 2016-11-10 MED FILL — SERTRALINE HCL 50 MG TABLET: 50 | 30 days supply | Qty: 30 | Fill #2

## 2016-11-10 MED FILL — MONTELUKAST SOD 10 MG TAB: 10 | 30 days supply | Qty: 30 | Fill #5

## 2016-11-10 MED FILL — BUPROPION HCL XL 300 MG TAB: 300 | 30 days supply | Qty: 30 | Fill #2

## 2016-11-14 MED FILL — AMLODIPINE BESYLATE 10 MG T: 10 | 30 days supply | Qty: 30 | Fill #3

## 2016-11-14 MED FILL — LEVOCETIRIZINE 5 MG TABLET: 5 | 30 days supply | Qty: 30 | Fill #4

## 2016-11-14 MED FILL — LEVOTHYROXINE 50 MCG TABLET: 50 | 30 days supply | Qty: 30 | Fill #2

## 2016-12-01 MED FILL — METOPROLOL TARTRATE 50 MG T: 50 | 30 days supply | Qty: 60 | Fill #3

## 2016-12-09 MED FILL — buPROPion HCL ER (XL) 300 M: 300 | 30 days supply | Qty: 30 | Fill #3

## 2016-12-14 MED FILL — LEVOTHYROXINE 50 MCG TABLET: 50 | 30 days supply | Qty: 30 | Fill #3

## 2016-12-14 MED FILL — LEVOCETIRIZINE 5 MG TABLET: 5 | 30 days supply | Qty: 30 | Fill #5

## 2016-12-14 MED FILL — SERTRALINE HCL 50 MG TABLET: 50 | 30 days supply | Qty: 30 | Fill #3

## 2016-12-15 MED FILL — MONTELUKAST SOD 10 MG TAB: 10 | 30 days supply | Qty: 30 | Fill #0

## 2016-12-28 MED FILL — AMLODIPINE BESYLATE 10 MG T: 10 | 30 days supply | Qty: 30 | Fill #4

## 2016-12-28 MED FILL — METOPROLOL TARTRATE 50 MG T: 50 | 30 days supply | Qty: 60 | Fill #4

## 2016-12-28 MED FILL — BREO ELLIPTA 200-25 MCG INH: 200-25 | 30 days supply | Qty: 60 | Fill #4

## 2016-12-28 MED FILL — HYDROCHLOROTHIAZIDE 25 MG T: 25 | 90 days supply | Qty: 90 | Fill #1

## 2017-01-11 MED FILL — LEVOTHYROXINE 50 MCG TABLET: 50 | 30 days supply | Qty: 30 | Fill #4

## 2017-01-11 MED FILL — SERTRALINE HCL 50 MG TABLET: 50 | 30 days supply | Qty: 30 | Fill #4

## 2017-01-11 MED FILL — SIMVASTATIN 20 MG TABLET: 20 | 30 days supply | Qty: 30 | Fill #1

## 2017-01-11 MED FILL — buPROPion HCL ER (XL) 300 M: 300 | 30 days supply | Qty: 30 | Fill #4

## 2017-01-11 MED FILL — MONTELUKAST SOD 10 MG TAB: 10 | 30 days supply | Qty: 30 | Fill #1

## 2017-01-11 MED FILL — LEVOCETIRIZINE 5 MG TABLET: 5 | 30 days supply | Qty: 30 | Fill #0

## 2017-01-24 MED FILL — AMLODIPINE BESYLATE 10 MG T: 10 | 30 days supply | Qty: 30 | Fill #5

## 2017-02-02 MED FILL — METOPROLOL TARTRATE 50 MG T: 50 | 30 days supply | Qty: 60 | Fill #5

## 2017-02-07 MED FILL — SIMVASTATIN 20 MG TABLET: 20 | 30 days supply | Qty: 30 | Fill #2

## 2017-02-07 MED FILL — LEVOTHYROXINE 50 MCG TABLET: 50 | 30 days supply | Qty: 30 | Fill #5

## 2017-02-07 MED FILL — SERTRALINE HCL 50 MG TABLET: 50 | 30 days supply | Qty: 30 | Fill #5

## 2017-02-07 MED FILL — LEVOCETIRIZINE 5 MG TABLET: 5 | 30 days supply | Qty: 30 | Fill #1

## 2017-02-08 MED FILL — BUPROPION HCL XL 300 MG TAB: 300 | 30 days supply | Qty: 30 | Fill #5

## 2017-03-01 MED FILL — BREO ELLIPTA 200-25 MCG INH: 200-25 | 30 days supply | Qty: 60 | Fill #5

## 2017-03-01 MED FILL — MONTELUKAST SOD 10 MG TAB: 10 | 30 days supply | Qty: 30 | Fill #0

## 2017-03-01 MED FILL — MOMETASONE FUROATE 50 MCG S: 50 | 30 days supply | Qty: 17 | Fill #1

## 2017-03-01 MED FILL — AMLODIPINE BESYLATE 10 MG T: 10 | 30 days supply | Qty: 30 | Fill #6

## 2017-03-01 MED FILL — METOPROLOL TARTRATE 50 MG T: 50 | 30 days supply | Qty: 60 | Fill #0

## 2017-03-15 MED FILL — SERTRALINE HCL 50 MG TABLET: 50 | 30 days supply | Qty: 30 | Fill #6

## 2017-03-15 MED FILL — LEVOCETIRIZINE 5 MG TABLET: 5 | 30 days supply | Qty: 30 | Fill #2

## 2017-03-15 MED FILL — LEVOTHYROXINE 50 MCG TABLET: 50 | 30 days supply | Qty: 30 | Fill #6

## 2017-03-15 MED FILL — SIMVASTATIN 20 MG TABLET: 20 | 30 days supply | Qty: 30 | Fill #3

## 2017-03-15 MED FILL — BUPROPION HCL XL 300 MG TAB: 300 | 30 days supply | Qty: 30 | Fill #6

## 2017-03-29 MED FILL — HYDROCHLOROTHIAZIDE 25 MG T: 25 | 30 days supply | Qty: 30 | Fill #2

## 2017-03-29 MED FILL — METOPROLOL TARTRATE 50 MG T: 50 | 30 days supply | Qty: 60 | Fill #1

## 2017-03-29 MED FILL — MONTELUKAST SOD 10 MG TAB: 10 | 30 days supply | Qty: 30 | Fill #1

## 2017-03-29 MED FILL — AMLODIPINE BESYLATE 10 MG T: 10 | 30 days supply | Qty: 30 | Fill #7

## 2017-04-12 MED FILL — SERTRALINE HCL 50 MG TABLET: 50 | 30 days supply | Qty: 30 | Fill #7

## 2017-04-12 MED FILL — SIMVASTATIN 20 MG TABLET: 20 | 30 days supply | Qty: 30 | Fill #4

## 2017-04-12 MED FILL — LEVOTHYROXINE 50 MCG TABLET: 50 | 30 days supply | Qty: 30 | Fill #7

## 2017-04-12 MED FILL — BUPROPION HCL XL 300 MG TAB: 300 | 30 days supply | Qty: 30 | Fill #7

## 2017-04-13 MED FILL — BREO ELLIPTA 200-25 MCG INH: 200-25 | 30 days supply | Qty: 60 | Fill #0

## 2017-04-13 MED FILL — LEVOCETIRIZINE 5 MG TABLET: 5 | 30 days supply | Qty: 30 | Fill #0

## 2017-04-28 MED FILL — AMLODIPINE BESYLATE 10 MG T: 10 | 30 days supply | Qty: 30 | Fill #8

## 2017-04-28 MED FILL — HYDROCHLOROTHIAZIDE 25 MG T: 25 | 30 days supply | Qty: 30 | Fill #3

## 2017-05-05 MED FILL — METOPROLOL TARTRATE 50 MG T: 50 | 30 days supply | Qty: 60 | Fill #2

## 2017-05-11 MED FILL — LEVOTHYROXINE 50 MCG TABLET: 50 | 30 days supply | Qty: 30 | Fill #8

## 2017-05-11 MED FILL — SERTRALINE HCL 50 MG TABLET: 50 | 30 days supply | Qty: 30 | Fill #8

## 2017-05-11 MED FILL — BUPROPION HCL XL 300 MG TAB: 300 | 30 days supply | Qty: 30 | Fill #8

## 2017-05-11 MED FILL — SIMVASTATIN 20 MG TABLET: 20 | 30 days supply | Qty: 30 | Fill #5

## 2017-06-01 MED FILL — METOPROLOL TARTRATE 50 MG T: 50 | 30 days supply | Qty: 60 | Fill #3

## 2017-06-01 MED FILL — AMLODIPINE BESYLATE 10 MG T: 10 | 30 days supply | Qty: 30 | Fill #9

## 2017-06-01 MED FILL — HYDROCHLOROTHIAZIDE 25 MG T: 25 | 30 days supply | Qty: 30 | Fill #4

## 2017-06-10 MED FILL — SERTRALINE HCL 50 MG TABLET: 50 | 30 days supply | Qty: 30 | Fill #9

## 2017-06-10 MED FILL — SIMVASTATIN 20 MG TABLET: 20 | 30 days supply | Qty: 30 | Fill #6

## 2017-06-10 MED FILL — BUPROPION HCL XL 300 MG TAB: 300 | 30 days supply | Qty: 30 | Fill #0

## 2017-06-10 MED FILL — LEVOTHYROXINE 50 MCG TABLET: 50 | 30 days supply | Qty: 30 | Fill #0

## 2017-06-30 MED FILL — AMLODIPINE BESYLATE 10 MG T: 10 | 30 days supply | Qty: 30 | Fill #0

## 2017-06-30 MED FILL — HYDROCHLOROTHIAZIDE 25 MG T: 25 | 30 days supply | Qty: 30 | Fill #0

## 2017-06-30 MED FILL — METOPROLOL TARTRATE 50 MG T: 50 | 30 days supply | Qty: 60 | Fill #4

## 2017-07-14 MED FILL — LEVOTHYROXINE 50 MCG TABLET: 50 | 30 days supply | Qty: 30 | Fill #1

## 2017-07-14 MED FILL — SERTRALINE HCL 50 MG TABLET: 50 | 30 days supply | Qty: 30 | Fill #0

## 2017-07-14 MED FILL — BUPROPION HCL XL 300 MG TAB: 300 | 30 days supply | Qty: 30 | Fill #1

## 2017-07-14 MED FILL — SIMVASTATIN 20 MG TABS: 20 | 30 days supply | Qty: 30 | Fill #0

## 2017-07-28 MED FILL — METOPROLOL TARTRATE 50 MG T: 50 | 30 days supply | Qty: 60 | Fill #5

## 2017-07-28 MED FILL — HYDROCHLOROTHIAZIDE 25 MG T: 25 | 30 days supply | Qty: 30 | Fill #1

## 2017-07-28 MED FILL — AMLODIPINE BESYLATE 10 MG T: 10 | 30 days supply | Qty: 30 | Fill #1

## 2017-08-10 MED FILL — BUPROPION HCL XL 300 MG TAB: 300 | 30 days supply | Qty: 30 | Fill #2

## 2017-08-10 MED FILL — SERTRALINE HCL 50 MG TABLET: 50 | 30 days supply | Qty: 30 | Fill #1

## 2017-08-10 MED FILL — SIMVASTATIN 20 MG TABS: 20 | 30 days supply | Qty: 30 | Fill #1

## 2017-08-10 MED FILL — LEVOTHYROXINE 50 MCG TABLET: 50 | 30 days supply | Qty: 30 | Fill #2

## 2017-08-30 MED FILL — HYDROCHLOROTHIAZIDE 25 MG T: 25 | 30 days supply | Qty: 30 | Fill #2

## 2017-08-30 MED FILL — AMLODIPINE BESYLATE 10 MG T: 10 | 30 days supply | Qty: 30 | Fill #2

## 2017-09-01 MED FILL — METOPROLOL TARTRATE 50 MG T: 50 | 30 days supply | Qty: 60 | Fill #0

## 2017-09-11 MED FILL — SIMVASTATIN 20 MG TABS: 20 | 30 days supply | Qty: 30 | Fill #2

## 2017-09-11 MED FILL — SERTRALINE HCL 50 MG TABLET: 50 | 30 days supply | Qty: 30 | Fill #2

## 2017-09-12 MED FILL — LEVOTHYROXINE 50 MCG TABLET: 50 | 30 days supply | Qty: 30 | Fill #0

## 2017-09-12 MED FILL — BuPROPion HCL ER (XL) 300 M: 300 | 30 days supply | Qty: 30 | Fill #3

## 2017-09-29 MED FILL — AMLODIPINE BESYLATE 10 MG T: 10 | 30 days supply | Qty: 30 | Fill #3

## 2017-09-29 MED FILL — METOPROLOL TARTRATE 50 MG T: 50 | 30 days supply | Qty: 60 | Fill #1

## 2017-09-30 MED FILL — HYDROCHLOROTHIAZIDE 25 MG T: 25 | 30 days supply | Qty: 30 | Fill #0

## 2017-10-11 MED FILL — LEVOTHYROXINE 50 MCG TABLET: 50 | 30 days supply | Qty: 30 | Fill #1

## 2017-10-12 MED FILL — SIMVASTATIN 20 MG TABS: 20 | 30 days supply | Qty: 30 | Fill #0

## 2017-10-12 MED FILL — SERTRALINE HCL 50 MG TABS: 50 | 30 days supply | Qty: 30 | Fill #0

## 2017-10-29 MED FILL — METOPROLOL TARTRATE 50 MG T: 50 | 30 days supply | Qty: 60 | Fill #2

## 2017-10-29 MED FILL — HYDROCHLOROTHIAZIDE 25 MG T: 25 | 30 days supply | Qty: 30 | Fill #1

## 2017-10-31 MED FILL — AMLODIPINE BESYLATE 10 MG T: 10 | 30 days supply | Qty: 30 | Fill #4

## 2017-11-10 MED FILL — SIMVASTATIN 20 MG TABLET: 20 | 30 days supply | Qty: 30 | Fill #1

## 2017-11-10 MED FILL — LEVOTHYROXINE 50 MCG TABLET: 50 | 30 days supply | Qty: 30 | Fill #2

## 2017-11-10 MED FILL — SERTRALINE HCL 50 MG TABLET: 50 | 30 days supply | Qty: 30 | Fill #1

## 2017-12-02 MED FILL — HYDROCHLOROTHIAZIDE 25 MG T: 25 | 30 days supply | Qty: 30 | Fill #2

## 2017-12-02 MED FILL — AMLODIPINE BESYLATE 10 MG T: 10 | 30 days supply | Qty: 30 | Fill #5

## 2017-12-02 MED FILL — METOPROLOL TARTRATE 50 MG T: 50 | 30 days supply | Qty: 60 | Fill #3

## 2017-12-12 MED FILL — SERTRALINE HCL 50 MG TABLET: 50 | 30 days supply | Qty: 30 | Fill #2

## 2017-12-12 MED FILL — LEVOTHYROXINE 50 MCG TABLET: 50 | 30 days supply | Qty: 30 | Fill #3

## 2017-12-12 MED FILL — SIMVASTATIN 20 MG TABLET: 20 | 30 days supply | Qty: 30 | Fill #2

## 2018-01-02 MED FILL — METOPROLOL TARTRATE 50 MG T: 50 | 30 days supply | Qty: 60 | Fill #4

## 2018-01-02 MED FILL — AMLODIPINE BESYLATE 10 MG T: 10 | 30 days supply | Qty: 30 | Fill #6

## 2018-01-02 MED FILL — HYDROCHLOROTHIAZIDE 25 MG T: 25 | 30 days supply | Qty: 30 | Fill #3

## 2018-01-11 MED FILL — SERTRALINE HCL 50 MG TABLET: 50 | 30 days supply | Qty: 30 | Fill #3

## 2018-01-11 MED FILL — LEVOTHYROXINE 50 MCG TABLET: 50 | 30 days supply | Qty: 30 | Fill #4

## 2018-01-18 MED FILL — SIMVASTATIN 20 MG TABLET: 20 | 30 days supply | Qty: 30 | Fill #3

## 2018-01-29 MED FILL — METOPROLOL TARTRATE 50 MG T: 50 | 30 days supply | Qty: 60 | Fill #5

## 2018-01-29 MED FILL — HYDROCHLOROTHIAZIDE 25 MG T: 25 | 30 days supply | Qty: 30 | Fill #4

## 2018-01-30 MED FILL — AMLODIPINE BESYLATE 10 MG T: 10 | 30 days supply | Qty: 30 | Fill #7

## 2018-02-09 MED FILL — SERTRALINE HCL 50 MG TABLET: 50 | 30 days supply | Qty: 30 | Fill #4

## 2018-02-09 MED FILL — LEVOTHYROXINE 50 MCG TABLET: 50 | 30 days supply | Qty: 30 | Fill #5

## 2018-02-23 MED FILL — AMLODIPINE BESYLATE 10 MG T: 10 | 30 days supply | Qty: 30 | Fill #8

## 2018-02-23 MED FILL — HYDROCHLOROTHIAZIDE 25 MG T: 25 | 30 days supply | Qty: 30 | Fill #5

## 2018-02-23 MED FILL — SIMVASTATIN 20 MG TABLET: 20 | 30 days supply | Qty: 30 | Fill #4

## 2018-03-06 MED FILL — SERTRALINE HCL 50 MG TABLET: 50 | 30 days supply | Qty: 30 | Fill #5

## 2018-03-06 MED FILL — METOPROLOL TARTRATE 50 MG T: 50 | 30 days supply | Qty: 60 | Fill #6

## 2018-03-06 MED FILL — LEVOTHYROXINE 50 MCG TABLET: 50 | 30 days supply | Qty: 30 | Fill #6

## 2018-03-27 ENCOUNTER — Ambulatory Visit
Admission: RE | Admit: 2018-03-27 | Discharge: 2018-03-27 | Disposition: A | Discharge status: Home / Self Care | Payer: No Typology Code available for payment source | Source: Ambulatory Visit | Attending: Family Medicine | Admitting: Family Medicine

## 2018-03-27 ENCOUNTER — Other Ambulatory Visit: Payer: Self-pay | Admitting: Family Medicine

## 2018-03-27 DIAGNOSIS — R0781 Pleurodynia: Secondary | ICD-10-CM

## 2018-03-27 DIAGNOSIS — M25511 Pain in right shoulder: Secondary | ICD-10-CM

## 2018-03-27 MED FILL — HYDROCODON-APAP 5-325: 5-325 | 5 days supply | Qty: 15 | Fill #0

## 2018-04-02 MED FILL — SIMVASTATIN 20 MG TABLET: 20 | 30 days supply | Qty: 30 | Fill #5

## 2018-04-02 MED FILL — HYDROCHLOROTHIAZIDE 25 MG T: 25 | 30 days supply | Qty: 30 | Fill #6

## 2018-04-02 MED FILL — METOPROLOL TARTRATE 50 MG T: 50 | 30 days supply | Qty: 60 | Fill #7

## 2018-04-02 MED FILL — AMLODIPINE BESYLATE 10 MG T: 10 | 30 days supply | Qty: 30 | Fill #9

## 2018-04-10 MED FILL — LEVOTHYROXINE 50 MCG TABLET: 50 | 30 days supply | Qty: 30 | Fill #7

## 2018-04-10 MED FILL — SERTRALINE HCL 50 MG TABLET: 50 | 30 days supply | Qty: 30 | Fill #6

## 2018-05-01 MED FILL — METOPROLOL TARTRATE 50 MG T: 50 | 30 days supply | Qty: 60 | Fill #8

## 2018-05-01 MED FILL — AMLODIPINE BESYLATE 10 MG T: 10 | 30 days supply | Qty: 30 | Fill #10

## 2018-05-01 MED FILL — HYDROCHLOROTHIAZIDE 25 MG T: 25 | 30 days supply | Qty: 30 | Fill #7

## 2018-05-08 MED FILL — MELOXICAM 7.5 MG TABLET: 7.5 | 30 days supply | Qty: 30 | Fill #0

## 2018-05-09 MED FILL — SIMVASTATIN 20 MG TABLET: 20 | 30 days supply | Qty: 30 | Fill #6

## 2018-05-09 MED FILL — SERTRALINE HCL 50 MG TABLET: 50 | 30 days supply | Qty: 30 | Fill #7

## 2018-05-09 MED FILL — LEVOTHYROXINE 50 MCG TABLET: 50 | 30 days supply | Qty: 30 | Fill #8

## 2018-05-31 MED FILL — CELECOXIB 200 MG CAP: 200 | 14 days supply | Qty: 28 | Fill #0

## 2018-06-01 MED FILL — HYDROCHLOROTHIAZIDE 25 MG T: 25 | 30 days supply | Qty: 30 | Fill #8

## 2018-06-04 MED FILL — METOPROLOL TARTRATE 50 MG T: 50 | 30 days supply | Qty: 60 | Fill #9

## 2018-06-04 MED FILL — AMLODIPINE BESYLATE 10 MG T: 10 | 30 days supply | Qty: 30 | Fill #11

## 2018-06-05 MED FILL — SERTRALINE HCL 50 MG TABLET: 50 | 30 days supply | Qty: 30 | Fill #8

## 2018-06-05 MED FILL — SIMVASTATIN 20 MG TABLET: 20 | 30 days supply | Qty: 30 | Fill #7

## 2018-06-05 MED FILL — LEVOTHYROXINE 50 MCG TABLET: 50 | 30 days supply | Qty: 30 | Fill #9

## 2018-06-20 MED FILL — CELECOXIB 200 MG CAP: 200 | 14 days supply | Qty: 28 | Fill #1

## 2018-07-03 MED FILL — AMLODIPINE BESYLATE 10 MG T: 10 | 30 days supply | Qty: 30 | Fill #0

## 2018-07-03 MED FILL — METOPROLOL TARTRATE 50 MG T: 50 | 30 days supply | Qty: 60 | Fill #10

## 2018-07-03 MED FILL — LEVOTHYROXINE 50 MCG TABLET: 50 | 30 days supply | Qty: 30 | Fill #10

## 2018-07-03 MED FILL — SIMVASTATIN 20 MG TABLET: 20 | 30 days supply | Qty: 30 | Fill #8

## 2018-07-03 MED FILL — HYDROCHLOROTHIAZIDE 25 MG T: 25 | 30 days supply | Qty: 30 | Fill #9

## 2018-07-03 MED FILL — SERTRALINE HCL 50 MG TABLET: 50 | 30 days supply | Qty: 30 | Fill #9

## 2018-08-01 MED FILL — HYDROCHLOROTHIAZIDE 25 MG T: 25 | 30 days supply | Qty: 30 | Fill #10

## 2018-08-01 MED FILL — METOPROLOL TARTRATE 50 MG T: 50 | 30 days supply | Qty: 60 | Fill #11

## 2018-08-01 MED FILL — AMLODIPINE BESYLATE 10 MG T: 10 | 30 days supply | Qty: 30 | Fill #1

## 2018-08-01 MED FILL — LEVOTHYROXINE 50 MCG TABLET: 50 | 30 days supply | Qty: 30 | Fill #11

## 2018-08-01 MED FILL — SERTRALINE HCL 50 MG TABLET: 50 | 30 days supply | Qty: 30 | Fill #10

## 2018-08-01 MED FILL — SIMVASTATIN 20 MG TABLET: 20 | 30 days supply | Qty: 30 | Fill #9

## 2018-08-30 MED FILL — SERTRALINE HCL 50 MG TABLET: 50 | 30 days supply | Qty: 30 | Fill #11

## 2018-08-30 MED FILL — HYDROCHLOROTHIAZIDE 25 MG T: 25 | 30 days supply | Qty: 30 | Fill #11

## 2018-08-31 MED FILL — METOPROLOL TARTRATE 50 MG T: 50 | 30 days supply | Qty: 60 | Fill #0

## 2018-09-07 MED FILL — LEVOTHYROXINE 50 MCG TABLET: 50 | 30 days supply | Qty: 30 | Fill #0

## 2018-09-07 MED FILL — SIMVASTATIN 20 MG TABLET: 20 | 30 days supply | Qty: 30 | Fill #10

## 2018-09-07 MED FILL — AMLODIPINE BESYLATE 10 MG T: 10 | 30 days supply | Qty: 30 | Fill #2

## 2018-09-18 MED FILL — CELECOXIB 200 MG CAPSULE: 200 | 14 days supply | Qty: 28 | Fill #0

## 2018-09-28 MED FILL — METOPROLOL TARTRATE 50 MG T: 50 | 30 days supply | Qty: 60 | Fill #1

## 2018-09-29 MED FILL — HYDROCHLOROTHIAZIDE 25 MG T: 25 | 30 days supply | Qty: 30 | Fill #0

## 2018-09-29 MED FILL — SERTRALINE HCL 50 MG TABS: 50 | 30 days supply | Qty: 30 | Fill #0

## 2018-10-11 MED FILL — LEVOTHYROXINE 50 MCG TABLET: 50 | 30 days supply | Qty: 30 | Fill #1

## 2018-10-11 MED FILL — AMLODIPINE BESYLATE 10 MG T: 10 | 30 days supply | Qty: 30 | Fill #3

## 2018-10-11 MED FILL — SIMVASTATIN 20 MG TABLET: 20 | 30 days supply | Qty: 30 | Fill #11

## 2018-10-29 MED FILL — METOPROLOL TARTRATE 50 MG T: 50 | 30 days supply | Qty: 60 | Fill #2

## 2018-10-29 MED FILL — HYDROCHLOROTHIAZIDE 25 MG T: 25 | 30 days supply | Qty: 30 | Fill #1

## 2018-10-30 MED FILL — SERTRALINE HCL 50 MG TABLET: 50 | 30 days supply | Qty: 30 | Fill #1

## 2018-11-06 MED FILL — LEVOTHYROXINE 50 MCG TABLET: 50 | 30 days supply | Qty: 30 | Fill #2

## 2018-11-06 MED FILL — AMLODIPINE BESYLATE 10 MG T: 10 | 30 days supply | Qty: 30 | Fill #4

## 2018-11-06 MED FILL — SIMVASTATIN 20 MG TABLET: 20 | 30 days supply | Qty: 30 | Fill #0

## 2018-11-27 MED FILL — HYDROCHLOROTHIAZIDE 25 MG T: 25 | 30 days supply | Qty: 30 | Fill #2

## 2018-12-04 MED FILL — SERTRALINE HCL 50 MG TABLET: 50 | 30 days supply | Qty: 30 | Fill #2

## 2018-12-04 MED FILL — LEVOTHYROXINE 50 MCG TABLET: 50 | 30 days supply | Qty: 30 | Fill #0

## 2018-12-04 MED FILL — METOPROLOL TARTRATE 50 MG T: 50 | 30 days supply | Qty: 60 | Fill #3

## 2018-12-04 MED FILL — AMLODIPINE BESYLATE 10 MG T: 10 | 30 days supply | Qty: 30 | Fill #5

## 2018-12-05 MED FILL — SIMVASTATIN 20 MG TABLET: 20 | 30 days supply | Qty: 30 | Fill #1

## 2018-12-25 MED FILL — SIMVASTATIN 20 MG TABLET: 20 | 30 days supply | Qty: 30 | Fill #1

## 2018-12-26 MED FILL — HYDROCHLOROTHIAZIDE 25 MG T: 25 | 30 days supply | Qty: 30 | Fill #0

## 2019-01-02 MED FILL — LEVOTHYROXINE 50 MCG TABLET: 50 | 30 days supply | Qty: 30 | Fill #1

## 2019-01-02 MED FILL — SERTRALINE HCL 50 MG TABLET: 50 | 30 days supply | Qty: 30 | Fill #0

## 2019-01-02 MED FILL — AMLODIPINE BESYLATE 10 MG T: 10 | 30 days supply | Qty: 30 | Fill #6

## 2019-01-02 MED FILL — METOPROLOL TARTRATE 50 MG T: 50 | 30 days supply | Qty: 60 | Fill #0

## 2019-01-24 MED FILL — HYDROCHLOROTHIAZIDE 25 MG T: 25 | 30 days supply | Qty: 30 | Fill #1

## 2019-01-31 MED FILL — LEVOTHYROXINE 50 MCG TABLET: 50 | 30 days supply | Qty: 30 | Fill #2

## 2019-01-31 MED FILL — SIMVASTATIN 20 MG TABLET: 20 | 30 days supply | Qty: 30 | Fill #2

## 2019-01-31 MED FILL — AMLODIPINE BESYLATE 10 MG T: 10 | 30 days supply | Qty: 30 | Fill #7

## 2019-01-31 MED FILL — METOPROLOL TARTRATE 50 MG T: 50 | 30 days supply | Qty: 60 | Fill #1

## 2019-01-31 MED FILL — SERTRALINE HCL 50 MG TABLET: 50 | 30 days supply | Qty: 30 | Fill #1

## 2019-02-21 MED FILL — HYDROCHLOROTHIAZIDE 25 MG T: 25 | 90 days supply | Qty: 90 | Fill #2

## 2019-03-06 MED FILL — SIMVASTATIN 20 MG TABLET: 20 | 90 days supply | Qty: 90 | Fill #3

## 2019-03-06 MED FILL — METOPROLOL TARTRATE 50 MG T: 50 | 90 days supply | Qty: 180 | Fill #2

## 2019-03-06 MED FILL — LEVOTHYROXINE 50 MCG TABLET: 50 | 30 days supply | Qty: 30 | Fill #3

## 2019-03-06 MED FILL — AMLODIPINE BESYLATE 10 MG T: 10 | 90 days supply | Qty: 90 | Fill #8

## 2019-03-06 MED FILL — SERTRALINE HCL 50 MG TABLET: 50 | 90 days supply | Qty: 90 | Fill #2

## 2019-05-21 MED FILL — HYDROCHLOROTHIAZIDE 25 MG T: 25 | 90 days supply | Qty: 90 | Fill #3

## 2019-05-21 MED FILL — LEVOTHYROXINE 50 MCG TABLET: 50 | 30 days supply | Qty: 30 | Fill #4

## 2019-05-31 MED FILL — SERTRALINE HCL 50 MG TABLET: 50 | 90 days supply | Qty: 90 | Fill #3

## 2019-05-31 MED FILL — SIMVASTATIN 20 MG TABLET: 20 | 90 days supply | Qty: 90 | Fill #4

## 2019-05-31 MED FILL — METOPROLOL TARTRATE 50 MG T: 50 | 90 days supply | Qty: 180 | Fill #3

## 2019-06-20 MED FILL — LEVOTHYROXINE 50 MCG TABLET: 50 | 30 days supply | Qty: 30 | Fill #5

## 2019-07-15 ENCOUNTER — Ambulatory Visit: Payer: Self-pay | Attending: Internal Medicine

## 2019-07-15 DIAGNOSIS — Z23 Encounter for immunization: Secondary | ICD-10-CM | POA: Insufficient documentation

## 2019-07-15 NOTE — Progress Notes (Signed)
   Covid-19 Vaccination Clinic  Name:  Wendy Blair    MRN: [id number] DOB: 1950-10-06  07/15/2019  [name] was observed post Covid-19 immunization for 15 minutes without incidence. She was provided with Vaccine Information Sheet and instruction to access the V-Safe system.   [name] was instructed to call 911 with any severe reactions post vaccine: Marland Kitchen Difficulty breathing  . Swelling of your face and throat  . A fast heartbeat  . A bad rash all over your body  . Dizziness and weakness    Immunizations Administered    Name Date Dose VIS Date Route   Pfizer COVID-19 Vaccine 07/15/2019  3:25 PM 0.3 mL 04/27/2019 Intramuscular   Manufacturer: [place]   Lot: [id number]   [place]: [id number]

## 2019-07-17 MED FILL — LEVOTHYROXINE 50 MCG TABLET: 50 | 30 days supply | Qty: 30 | Fill #6

## 2019-08-14 ENCOUNTER — Ambulatory Visit: Payer: Self-pay | Attending: Internal Medicine

## 2019-08-14 DIAGNOSIS — Z23 Encounter for immunization: Secondary | ICD-10-CM

## 2019-08-14 NOTE — Progress Notes (Signed)
   Covid-19 Vaccination Clinic  Name:  ARIADNNE OTREMBA    MRN: [id number] DOB: February 29, 1951  08/14/2019  Ms. Bertholf was observed post Covid-19 immunization for 15 minutes without incident. She was provided with Vaccine Information Sheet and instruction to access the V-Safe system.   Ms. Mcmackin was instructed to call 911 with any severe reactions post vaccine: Marland Kitchen Difficulty breathing  . Swelling of face and throat  . A fast heartbeat  . A bad rash all over body  . Dizziness and weakness   Immunizations Administered    Name Date Dose VIS Date Route   Pfizer COVID-19 Vaccine 08/14/2019  8:34 AM 0.3 mL 04/27/2019 Intramuscular   Manufacturer: [place]   Lot: [id number]   [place]: [id number]

## 2019-08-30 MED FILL — LEVOTHYROXINE 50 MCG TABLET: 50 | 30 days supply | Qty: 30 | Fill #7

## 2019-08-30 MED FILL — HYDROCHLOROTHIAZIDE 25 MG T: 25 | 90 days supply | Qty: 90 | Fill #4

## 2019-08-30 MED FILL — METOPROLOL TARTRATE 50 MG T: 50 | 90 days supply | Qty: 180 | Fill #4

## 2019-08-30 MED FILL — SIMVASTATIN 20 MG TABLET: 20 | 90 days supply | Qty: 90 | Fill #5

## 2019-08-30 MED FILL — AMLODIPINE BESYLATE 10 MG T: 10 | 90 days supply | Qty: 90 | Fill #0

## 2019-08-30 MED FILL — SERTRALINE HCL 50 MG TABLET: 50 | 90 days supply | Qty: 90 | Fill #4

## 2019-11-13 MED FILL — LEVOTHYROXINE 50 MCG TABLET: 50 | 30 days supply | Qty: 30 | Fill #8

## 2019-11-29 MED FILL — AMLODIPINE BESYLATE 10 MG T: 10 | 90 days supply | Qty: 90 | Fill #1

## 2019-11-29 MED FILL — SERTRALINE HCL 50 MG TABS: 50 | 30 days supply | Qty: 30 | Fill #5

## 2019-11-29 MED FILL — METOPROLOL TARTRATE 50 MG T: 50 | 30 days supply | Qty: 60 | Fill #5

## 2019-11-29 MED FILL — HYDROCHLOROTHIAZIDE 25 MG T: 25 | 30 days supply | Qty: 30 | Fill #5

## 2019-12-06 MED FILL — SIMVASTATIN 20 MG TABLET: 20 | 90 days supply | Qty: 90 | Fill #0

## 2019-12-17 MED FILL — SIMVASTATIN 20 MG TABLET: 20 | 90 days supply | Qty: 90 | Fill #0

## 2019-12-18 ENCOUNTER — Other Ambulatory Visit (HOSPITAL_COMMUNITY): Payer: Self-pay | Admitting: Family Medicine

## 2019-12-18 MED FILL — LEVOTHYROXINE 50 MCG TABLET: 50 | 90 days supply | Qty: 90 | Fill #0

## 2019-12-28 ENCOUNTER — Other Ambulatory Visit (HOSPITAL_COMMUNITY): Payer: Self-pay | Admitting: Family Medicine

## 2019-12-28 MED FILL — SERTRALINE HCL 50 MG TABLET: 50 | 90 days supply | Qty: 90 | Fill #0

## 2019-12-28 MED FILL — HYDROCHLOROTHIAZIDE 25 MG T: 25 | 90 days supply | Qty: 90 | Fill #0

## 2019-12-28 MED FILL — METOPROLOL TARTRATE 50 MG T: 50 | 90 days supply | Qty: 180 | Fill #0

## 2020-01-04 ENCOUNTER — Ambulatory Visit: Payer: Self-pay | Admitting: Cardiology

## 2020-01-11 ENCOUNTER — Telehealth: Payer: Self-pay | Admitting: Pulmonary Disease

## 2020-01-11 DIAGNOSIS — J4530 Mild persistent asthma, uncomplicated: Secondary | ICD-10-CM

## 2020-01-11 NOTE — Telephone Encounter (Signed)
Freddi Starr, MD  You 1 hour ago (9:16 AM)   Please have patient scheduled for PFTs prior to her visit on 02/01/20. Thanks   Routing comment    Attempted to call pt to get her scheduled for PFT which is needing to be done per Dr. Erin Fulling as pt is needing to have surgical clearance. Unable to reach pt. Left message for her to return call.  When pt returns call, please schedule her a PFT if possible prior to her 02/01/20 visit. Thanks!  Pt has had her covid vaccines and was told in message that was left to her that she will need to bring vaccine card with her to the PFT appt. Routing this to front desk pool so they can help get pt scheduled for appt.

## 2020-01-15 NOTE — Telephone Encounter (Signed)
Scheduled patient for pft on 01/22/2020 - pt aware to bring vaccination card -pr

## 2020-01-16 ENCOUNTER — Ambulatory Visit: Payer: Self-pay | Admitting: Cardiology

## 2020-01-16 DIAGNOSIS — J449 Chronic obstructive pulmonary disease, unspecified: Secondary | ICD-10-CM | POA: Insufficient documentation

## 2020-01-16 DIAGNOSIS — I10 Essential (primary) hypertension: Secondary | ICD-10-CM | POA: Insufficient documentation

## 2020-01-16 DIAGNOSIS — E782 Mixed hyperlipidemia: Secondary | ICD-10-CM | POA: Insufficient documentation

## 2020-01-16 DIAGNOSIS — Z01818 Encounter for other preprocedural examination: Secondary | ICD-10-CM | POA: Insufficient documentation

## 2020-01-16 DIAGNOSIS — R7303 Prediabetes: Secondary | ICD-10-CM | POA: Insufficient documentation

## 2020-01-16 NOTE — Progress Notes (Signed)
Rescheduled

## 2020-01-22 ENCOUNTER — Ambulatory Visit (INDEPENDENT_AMBULATORY_CARE_PROVIDER_SITE_OTHER): Payer: Medicare Other | Admitting: Pulmonary Disease

## 2020-01-22 ENCOUNTER — Other Ambulatory Visit: Payer: Self-pay

## 2020-01-22 DIAGNOSIS — J4530 Mild persistent asthma, uncomplicated: Secondary | ICD-10-CM

## 2020-01-22 LAB — PULMONARY FUNCTION TEST
DL/VA % pred: 120 %
DL/VA: 5.12 ml/min/mmHg/L
DLCO cor % pred: 103 %
DLCO cor: 18.36 ml/min/mmHg
DLCO unc % pred: 103 %
DLCO unc: 18.36 ml/min/mmHg
FEF 25-75 Post: 1.53 L/sec
FEF 25-75 Pre: 1.03 L/sec
FEF2575-%Change-Post: 48 %
FEF2575-%Pred-Post: 83 %
FEF2575-%Pred-Pre: 56 %
FEV1-%Change-Post: 11 %
FEV1-%Pred-Post: 71 %
FEV1-%Pred-Pre: 64 %
FEV1-Post: 1.47 L
FEV1-Pre: 1.32 L
FEV1FVC-%Change-Post: 0 %
FEV1FVC-%Pred-Pre: 99 %
FEV6-%Change-Post: 12 %
FEV6-%Pred-Post: 75 %
FEV6-%Pred-Pre: 67 %
FEV6-Post: 1.94 L
FEV6-Pre: 1.73 L
FEV6FVC-%Change-Post: 0 %
FEV6FVC-%Pred-Post: 104 %
FEV6FVC-%Pred-Pre: 104 %
FVC-%Change-Post: 12 %
FVC-%Pred-Post: 71 %
FVC-%Pred-Pre: 64 %
FVC-Post: 1.95 L
FVC-Pre: 1.73 L
Post FEV1/FVC ratio: 76 %
Post FEV6/FVC ratio: 100 %
Pre FEV1/FVC ratio: 76 %
Pre FEV6/FVC Ratio: 100 %
RV % pred: 127 %
RV: 2.55 L
TLC % pred: 99 %
TLC: 4.58 L

## 2020-01-22 NOTE — Progress Notes (Signed)
Full PFT performed today. °

## 2020-01-29 NOTE — Progress Notes (Signed)
  Patient referred by Wolters, Sharon, MD for pre-op risk stratification  Subjective:   Wendy Blair, female    DOB: 07/16/1950, 69 y.o.   MRN: [id number]   Chief Complaint  Patient presents with  . Pre-op Exam  . Nicotine Dependence  . New Patient (Initial Visit)     HPI  69 y.o. Caucasian female with hypertension, hyperlipidemia, hypothyroidism, tobacco dependence, referred for pre-op evaluation.  Patient is a retired RN.  Her physical activity is severely limited by hip and knee pain.  With regular sedentary lifestyle, she does not have any chest pain or shortness of breath.  She has relatively controlled hypertension.  She quit smoking in 2005, but started again in 2014.   Past Medical History:  Diagnosis Date  . GERD (gastroesophageal reflux disease)   . Hyperlipidemia   . Hypertension   . Hypothyroidism      Past Surgical History:  Procedure Laterality Date  . TONSILLECTOMY       Social History   Tobacco Use  Smoking Status Current Some Day Smoker  . Packs/day: 0.25  . Years: 35.00  . Pack years: 8.75  . Types: Cigarettes  Smokeless Tobacco Never Used  Tobacco Comment   quite 2007 and started again 2014    Social History   Substance and Sexual Activity  Alcohol Use Yes     Family History  Problem Relation Age of Onset  . Prostate cancer Father   . Lymphoma Brother   . Breast cancer Maternal Grandmother   . Stroke Paternal Grandmother   . Prostate cancer Brother      Current Outpatient Medications on File Prior to Visit  Medication Sig Dispense Refill  . albuterol (PROAIR HFA) 108 (90 BASE) MCG/ACT inhaler Inhale 2 puffs into the lungs every 4 (four) hours as needed for wheezing. 3 Inhaler prn  . amLODipine (NORVASC) 10 MG tablet Take 10 mg by mouth daily.    . calcium carbonate (OS-CAL) 600 MG TABS Take 600 mg by mouth 2 (two) times daily with a meal.    . Cholecalciferol (VITAMIN D-3) 1000 UNITS CAPS Take 1 capsule by mouth 2 (two)  times daily.    . hydrochlorothiazide (HYDRODIURIL) 25 MG tablet Take 25 mg by mouth every morning.    . levothyroxine (SYNTHROID) 50 MCG tablet Take 50 mcg by mouth daily before breakfast.    . loratadine (CLARITIN) 10 MG tablet Take 10 mg by mouth daily.    . metoprolol tartrate (LOPRESSOR) 50 MG tablet Take 50 mg by mouth 2 (two) times daily.    . Multiple Vitamins-Minerals (MULTIVITAMIN PO) Take 1 tablet by mouth daily.    . naproxen sodium (ALEVE) 220 MG tablet Take 220 mg by mouth.    . sertraline (ZOLOFT) 50 MG tablet Take 50 mg by mouth daily.    . simvastatin (ZOCOR) 20 MG tablet Take 1 tablet by mouth daily.     No current facility-administered medications on file prior to visit.    Cardiovascular and other pertinent studies:   EKG 01/30/2020: Sinus rhythm 68 bpm Normal EKG  Recent labs: 12/14/2019: Glucose 96, BUN/Cr 15/0.60. EGFR 99. Na/K 139/4.2. Rest of the CMP normal H/H 14.2/42.5. MCV 93.6. Platelets 203 HbA1C 5.6% Chol 224, TG 101, HDL 49, LDL 157 TSH 1.36 normal    Review of Systems  Cardiovascular: Negative for chest pain, dyspnea on exertion, leg swelling, palpitations and syncope.  Musculoskeletal: Positive for joint pain.           Vitals:   01/30/20 0900  BP: 140/89  Pulse: 69  Resp: 16  SpO2: 96%     Body mass index is 41.99 kg/m. Filed Weights   01/30/20 0900  Weight: 215 lb (97.5 kg)     Objective:   Physical Exam Vitals and nursing note reviewed.  Constitutional:      General: She is not in acute distress. Neck:     Vascular: No JVD.  Cardiovascular:     Rate and Rhythm: Normal rate and regular rhythm.     Heart sounds: Normal heart sounds. No murmur heard.   Pulmonary:     Effort: Pulmonary effort is normal.     Breath sounds: Normal breath sounds. No wheezing or rales.            Assessment & Recommendations:   69 y.o. Caucasian female with hypertension, hyperlipidemia, hypothyroidism, tobacco dependence,  referred for pre-op evaluation.  Preoperative stratification: Risk factors for CAD including hypertension, tobacco dependence.  No angina symptoms, however functional capacity assessment limited due to her musculoskeletal complaints.  Recommend echocardiogram and Lexiscan nuclear stress test for risk stratification.  Hypertension: Fairly well controlled  Hyperlipidemia: 10 yr ASCVD risk 13% Switch simvastatin to rosuvastatin 20 mg daily  Tobacco dependence:  Tobacco cessation counseling:  - Currently smoking 1/2 packs/day   - Patient was informed of the dangers of tobacco abuse including stroke, cancer, and MI, as well as benefits of tobacco cessation. - Patient is willing to quit at this time. - Approximately 5 mins were spent counseling patient cessation techniques. We discussed various methods to help quit smoking, including deciding on a date to quit, joining a support group, pharmacological agents. Patient would like to quit on her own.   - I will reassess her progress at the next follow-up visit   Thank you for referring the patient to us. Please feel free to contact with any questions.   Manish J Patwardhan, MD Pager: [phone] Office: [phone]  

## 2020-01-30 ENCOUNTER — Ambulatory Visit: Payer: Medicare Other | Admitting: Cardiology

## 2020-01-30 ENCOUNTER — Other Ambulatory Visit: Payer: Self-pay | Admitting: Cardiology

## 2020-01-30 ENCOUNTER — Encounter: Payer: Self-pay | Admitting: Cardiology

## 2020-01-30 ENCOUNTER — Other Ambulatory Visit: Payer: Self-pay

## 2020-01-30 VITALS — BP 140/89 | HR 69 | Resp 16 | Ht 60.0 in | Wt 215.0 lb

## 2020-01-30 DIAGNOSIS — I10 Essential (primary) hypertension: Secondary | ICD-10-CM

## 2020-01-30 DIAGNOSIS — Z01818 Encounter for other preprocedural examination: Secondary | ICD-10-CM

## 2020-01-30 DIAGNOSIS — Z01810 Encounter for preprocedural cardiovascular examination: Secondary | ICD-10-CM

## 2020-01-30 DIAGNOSIS — F17200 Nicotine dependence, unspecified, uncomplicated: Secondary | ICD-10-CM

## 2020-01-30 DIAGNOSIS — E782 Mixed hyperlipidemia: Secondary | ICD-10-CM

## 2020-01-30 MED ORDER — ROSUVASTATIN CALCIUM 20 MG PO TABS: 20.0000 mg | ORAL_TABLET | Freq: Every day | ORAL | 3 refills | Status: DC

## 2020-01-30 MED FILL — ROSUVASTATIN CALCIUM 20 MG: 20 | 30 days supply | Qty: 30 | Fill #0

## 2020-02-01 ENCOUNTER — Other Ambulatory Visit: Payer: Self-pay

## 2020-02-01 ENCOUNTER — Ambulatory Visit (INDEPENDENT_AMBULATORY_CARE_PROVIDER_SITE_OTHER): Payer: Medicare Other | Admitting: Pulmonary Disease

## 2020-02-01 ENCOUNTER — Encounter: Payer: Self-pay | Admitting: Pulmonary Disease

## 2020-02-01 VITALS — BP 126/62 | HR 73 | Ht 61.0 in | Wt 214.6 lb

## 2020-02-01 DIAGNOSIS — R0683 Snoring: Secondary | ICD-10-CM | POA: Diagnosis not present

## 2020-02-01 DIAGNOSIS — F17200 Nicotine dependence, unspecified, uncomplicated: Secondary | ICD-10-CM | POA: Diagnosis not present

## 2020-02-01 DIAGNOSIS — R05 Cough: Secondary | ICD-10-CM | POA: Diagnosis not present

## 2020-02-01 DIAGNOSIS — R059 Cough, unspecified: Secondary | ICD-10-CM

## 2020-02-01 MED ORDER — UMECLIDINIUM BROMIDE 62.5 MCG/INH IN AEPB: 1.0000 | INHALATION_SPRAY | Freq: Every day | RESPIRATORY_TRACT | Status: DC

## 2020-02-01 NOTE — Patient Instructions (Addendum)
Start Incruse inhaler daily, one inhalation daily for cough/wheezing We will schedule you for a low dose chest CT for lung cancer screening. We will call you with the results. We will schedule follow up in 12 months

## 2020-02-01 NOTE — Progress Notes (Signed)
Synopsis: Referred by Edmonia Lynch, MD for pre-op evaluation  Subjective:   PATIENT ID: Wendy Blair GENDER: female DOB: [birth year], MRN: [id number]   HPI  Chief Complaint  Patient presents with  . Consult    referred for surgical clearance for hip/knee surgery, COPD, denies any breathing issues   Wendy Blair is a 69 year old woman, daily smoker with history of hypertension, hyperlipidemia and COPD who is referred to pulmonary clinic for pre-op evaluation for orthopedic surgery.   She reports intermittent wheezing and daily cough with sputum production that is whitish/grey in color. She denies chest tightness or chest pain. She is not limited by shortness of breath in her daily activities. She reports being more limited by her hip and knee pain. She is able to climb two flights of stairs without stopping or issues with dyspnea.   She has a 40+ pack year smoking history. She smoked 2 packs per day for at least 20 years. She started smoking when she was 69 years old. She quit in 2006, then started smoking again in 2013. She currently smokes 3-6 cigarettes daily.   She does report night time snoring while sleeping. STOP BANG score is 5-6. Her serum bicarbonate level is 62mmol/L on recent labs from 12/14/2019.    Pulmonary function test on 01/22/20 show post FEV1/FVC ratio 76, FEV1 71%, FVC 71% with an 11% increase in FEV1 post-bronchodilation and a 261mL improvement in FVC post bronchodilation. TLC 99%, DLCO 103%.  Past Medical History:  Diagnosis Date  . Anxiety and depression   . GERD (gastroesophageal reflux disease)   . Hyperlipidemia   . Hypertension   . Hypothyroidism      Family History  Problem Relation Age of Onset  . Prostate cancer Father   . Lymphoma Brother   . Breast cancer Maternal Grandmother   . Stroke Paternal Grandmother   . Prostate cancer Brother      Social History   Socioeconomic History  . Marital status: Legally Separated    Spouse name:  Not on file  . Number of children: 2  . Years of education: Not on file  . Highest education level: Not on file  Occupational History  . Not on file  Tobacco Use  . Smoking status: Current Some Day Smoker    Packs/day: 0.25    Years: 35.00    Pack years: 8.75    Types: Cigarettes  . Smokeless tobacco: Never Used  . Tobacco comment: quite 2007 and started again 2014-smokes 3-6 per day  Vaping Use  . Vaping Use: Never used  Substance and Sexual Activity  . Alcohol use: Yes    Comment: occ  . Drug use: No  . Sexual activity: Not on file  Other Topics Concern  . Not on file  Social History Narrative  . Not on file   Social Determinants of Health   Financial Resource Strain:   . Difficulty of Paying Living Expenses: Not on file  Food Insecurity:   . Worried About Charity fundraiser in the Last Year: Not on file  . Ran Out of Food in the Last Year: Not on file  Transportation Needs:   . Lack of Transportation (Medical): Not on file  . Lack of Transportation (Non-Medical): Not on file  Physical Activity:   . Days of Exercise per Week: Not on file  . Minutes of Exercise per Session: Not on file  Stress:   . Feeling of Stress : Not on file  Social Connections:   . Frequency of Communication with Friends and Family: Not on file  . Frequency of Social Gatherings with Friends and Family: Not on file  . Attends Religious Services: Not on file  . Active Member of Clubs or Organizations: Not on file  . Attends Archivist Meetings: Not on file  . Marital Status: Not on file  Intimate Partner Violence:   . Fear of Current or Ex-Partner: Not on file  . Emotionally Abused: Not on file  . Physically Abused: Not on file  . Sexually Abused: Not on file     Allergies  Allergen Reactions  . Penicillins     Rash and itching     Outpatient Medications Prior to Visit  Medication Sig Dispense Refill  . amLODipine (NORVASC) 10 MG tablet Take 10 mg by mouth daily.    .  calcium carbonate (OS-CAL) 600 MG TABS Take 600 mg by mouth daily with breakfast.     . Cholecalciferol (VITAMIN D-3) 1000 UNITS CAPS Take 1 capsule by mouth 2 (two) times daily.    . hydrochlorothiazide (HYDRODIURIL) 25 MG tablet Take 25 mg by mouth every morning.    Marland Kitchen levothyroxine (SYNTHROID) 50 MCG tablet Take 50 mcg by mouth daily before breakfast.    . loratadine (CLARITIN) 10 MG tablet Take 10 mg by mouth daily.    . metoprolol tartrate (LOPRESSOR) 50 MG tablet Take 50 mg by mouth 2 (two) times daily.    . Multiple Vitamins-Minerals (MULTIVITAMIN PO) Take 1 tablet by mouth daily.    . naproxen sodium (ALEVE) 220 MG tablet Take 220 mg by mouth.    . rosuvastatin (CRESTOR) 20 MG tablet Take 1 tablet (20 mg total) by mouth daily. 30 tablet 3  . sertraline (ZOLOFT) 50 MG tablet Take 50 mg by mouth daily.    Marland Kitchen albuterol (PROAIR HFA) 108 (90 BASE) MCG/ACT inhaler Inhale 2 puffs into the lungs every 4 (four) hours as needed for wheezing. 3 Inhaler prn   No facility-administered medications prior to visit.    Review of Systems  Constitutional: Negative for chills, diaphoresis, fever and malaise/fatigue.  HENT: Negative for congestion, nosebleeds, sinus pain and sore throat.   Eyes: Negative for double vision.  Respiratory: Positive for cough and wheezing. Negative for hemoptysis, sputum production and shortness of breath.   Cardiovascular: Negative for chest pain, palpitations, orthopnea, leg swelling and PND.  Gastrointestinal: Negative for abdominal pain, blood in stool, diarrhea, heartburn, melena, nausea and vomiting.  Genitourinary: Negative for dysuria, frequency and hematuria.  Musculoskeletal: Negative for myalgias.  Skin: Negative for itching and rash.  Neurological: Negative for dizziness, weakness and headaches.  Endo/Heme/Allergies: Does not bruise/bleed easily.  Psychiatric/Behavioral: Negative for depression and substance abuse. The patient is not nervous/anxious.        Objective:  Physical Exam Constitutional:      General: She is not in acute distress.    Appearance: She is obese. She is not ill-appearing.  HENT:     Head: Normocephalic and atraumatic.     Nose: Nose normal. No congestion.     Mouth/Throat:     Mouth: Mucous membranes are moist.     Pharynx: Oropharynx is clear.  Eyes:     Extraocular Movements: Extraocular movements intact.     Conjunctiva/sclera: Conjunctivae normal.     Pupils: Pupils are equal, round, and reactive to light.  Cardiovascular:     Rate and Rhythm: Normal rate.     Pulses: Normal pulses.  Heart sounds: Normal heart sounds.  Pulmonary:     Effort: Pulmonary effort is normal.     Breath sounds: Normal breath sounds. No wheezing or rhonchi.  Abdominal:     General: Bowel sounds are normal. There is no distension.     Palpations: Abdomen is soft.     Tenderness: There is no abdominal tenderness.  Musculoskeletal:     Cervical back: Neck supple.     Right lower leg: No edema.     Left lower leg: No edema.  Lymphadenopathy:     Cervical: No cervical adenopathy.  Skin:    General: Skin is warm and dry.     Findings: No lesion or rash.  Neurological:     General: No focal deficit present.     Mental Status: She is alert and oriented to person, place, and time. Mental status is at baseline.  Psychiatric:        Mood and Affect: Mood normal.        Behavior: Behavior normal.        Thought Content: Thought content normal.        Judgment: Judgment normal.     Vitals:   02/01/20 0912  BP: 126/62  Pulse: 73  SpO2: 95%  Weight: 214 lb 9.6 oz (97.3 kg)  Height: 5\' 1"  (1.549 m)    Chest imaging: CT Chest 06/23/12: Mildly displaced acute fractures of the right sixth, seventh and  eighth ribs with associated adjacent pleural thickening and a small right pleural effusion. No emphysematous changes noted.   PFT: Reviewed, as above in HPI.  Labs: 12/14/19 CBC: WBC 9.3, HGB 14.2, HCT 42.5, PLT  203 BMP: Na 139, K 4.2, Cl 104, CO2 29, Cr 0.6, BUN 15 TSH 1.36    Assessment & Plan:   Cough - Plan: umeclidinium bromide (INCRUSE ELLIPTA) 62.5 MCG/INH 1 puff  Tobacco dependence - Plan: Ambulatory Referral for Lung Cancer Scre  Current smoker - Plan: Ambulatory Referral for Lung Cancer Scre  Snoring  Discussion: Patient has reduced FEV1 and FVC in the setting of obesity with normal FEV1/FVC ratio, normal lung volumes and normal diffusion capacity. She does have a significant response to bronchodilators given the improvement in her FVC of 269mL. Given her clinical symptoms of wheezing and productive cough, she would benefit from bronchodilator therapy with daily Incruse Ellipta.   She does have a significant smoking history of 40+pack years so she will be referred to our lung cancer screening program. We discussed smoking cessation modalities and she wishes to quit on her own at this time as she has done in the past.   In regards to her upcoming orthopedic surgeries, she has 0.26% to 1.02% risk of post-operative respiratory failure based on the [facility] Respiratory Failure Risk Calculator. There is risk for underlying obstructive sleep apnea given her STOP BANG score of 5-6 and signs of some CO2 retention on her BMP. Would recommend extubating to CPAP or BIPAP in the PACU. We will consider home sleep testing in the future.   Freda Jackson, MD [facility] Office: [phone]    Current Outpatient Medications:  .  amLODipine (NORVASC) 10 MG tablet, Take 10 mg by mouth daily., Disp: , Rfl:  .  calcium carbonate (OS-CAL) 600 MG TABS, Take 600 mg by mouth daily with breakfast. , Disp: , Rfl:  .  Cholecalciferol (VITAMIN D-3) 1000 UNITS CAPS, Take 1 capsule by mouth 2 (two) times daily., Disp: , Rfl:  .  hydrochlorothiazide (HYDRODIURIL) 25 MG tablet, Take 25 mg by mouth every morning., Disp: , Rfl:  .  levothyroxine (SYNTHROID) 50 MCG tablet, Take 50 mcg by mouth  daily before breakfast., Disp: , Rfl:  .  loratadine (CLARITIN) 10 MG tablet, Take 10 mg by mouth daily., Disp: , Rfl:  .  metoprolol tartrate (LOPRESSOR) 50 MG tablet, Take 50 mg by mouth 2 (two) times daily., Disp: , Rfl:  .  Multiple Vitamins-Minerals (MULTIVITAMIN PO), Take 1 tablet by mouth daily., Disp: , Rfl:  .  naproxen sodium (ALEVE) 220 MG tablet, Take 220 mg by mouth., Disp: , Rfl:  .  rosuvastatin (CRESTOR) 20 MG tablet, Take 1 tablet (20 mg total) by mouth daily., Disp: 30 tablet, Rfl: 3 .  sertraline (ZOLOFT) 50 MG tablet, Take 50 mg by mouth daily., Disp: , Rfl:   Current Facility-Administered Medications:  .  umeclidinium bromide (INCRUSE ELLIPTA) 62.5 MCG/INH 1 puff, 1 puff, Inhalation, Daily, Vera Furniss, Cheryle Horsfall, MD

## 2020-02-06 ENCOUNTER — Other Ambulatory Visit: Payer: Self-pay

## 2020-02-06 ENCOUNTER — Ambulatory Visit: Payer: Medicare Other

## 2020-02-06 DIAGNOSIS — Z01810 Encounter for preprocedural cardiovascular examination: Secondary | ICD-10-CM

## 2020-02-06 DIAGNOSIS — Z01818 Encounter for other preprocedural examination: Secondary | ICD-10-CM

## 2020-02-07 ENCOUNTER — Ambulatory Visit: Payer: Medicare Other

## 2020-02-07 DIAGNOSIS — I10 Essential (primary) hypertension: Secondary | ICD-10-CM

## 2020-02-12 NOTE — Progress Notes (Signed)
She had an Echo on 02/07/2020

## 2020-02-27 ENCOUNTER — Other Ambulatory Visit (HOSPITAL_COMMUNITY): Payer: Self-pay | Admitting: Physician Assistant

## 2020-02-27 MED FILL — HYDROCODON-APAP 5-325: 5-325 | 3 days supply | Qty: 30 | Fill #0

## 2020-02-27 MED FILL — AMLODIPINE BESYLATE 10 MG T: 10 | 90 days supply | Qty: 90 | Fill #0

## 2020-02-29 NOTE — H&P (Signed)
HIP ARTHROPLASTY ADMISSION H&P  Patient ID: Wendy Blair MRN: [id number] DOB/AGE: Aug 09, 1950 69 y.o.  Chief Complaint: right hip pain.  Planned Procedure Date: 03/18/2020  Medical Clearance by Dr. Stephanie Acre Cardiac Clearance by Dr. Virgina Jock Additional clearance by Dr. Erin Fulling (Pulmonary)  HPI: Wendy Blair is a 69 y.o. female who presents for evaluation of OA RIGHT HIP. The patient has a history of pain and functional disability in the right hip due to arthritis and has failed non-surgical conservative treatments for greater than 12 weeks to include NSAID's and/or analgesics, corticosteriod injections, flexibility and strengthening excercises, use of assistive devices and activity modification.  Onset of symptoms was abrupt, starting 2 years ago with rapidlly worsening course since that time. The patient noted no past surgery on the right hip.  Patient currently rates pain at 8 out of 10 with activity. Patient has night pain, worsening of pain with activity and weight bearing, pain that interferes with activities of daily living and pain with passive range of motion.  Patient has evidence of periarticular osteophytes and joint space narrowing by imaging studies.  There is no active infection.  Past Medical History:  Diagnosis Date  . Anxiety and depression   . GERD (gastroesophageal reflux disease)   . Hyperlipidemia   . Hypertension   . Hypothyroidism    Past Surgical History:  Procedure Laterality Date  . TONSILLECTOMY     Allergies  Allergen Reactions  . Penicillins     Rash and itching   Prior to Admission medications   Medication Sig Start Date End Date Taking? Authorizing Provider  amLODipine (NORVASC) 10 MG tablet Take 10 mg by mouth daily. 11/29/19   [provider]  calcium carbonate (OS-CAL) 600 MG TABS Take 600 mg by mouth daily with breakfast.     [provider]  Cholecalciferol (VITAMIN D-3) 1000 UNITS CAPS Take 1 capsule by mouth 2 (two)  times daily.    [provider]  hydrochlorothiazide (HYDRODIURIL) 25 MG tablet Take 25 mg by mouth every morning. 12/28/19   [provider]  levothyroxine (SYNTHROID) 50 MCG tablet Take 50 mcg by mouth daily before breakfast.    [provider]  loratadine (CLARITIN) 10 MG tablet Take 10 mg by mouth daily.    [provider]  metoprolol tartrate (LOPRESSOR) 50 MG tablet Take 50 mg by mouth 2 (two) times daily. 12/28/19   [provider]  Multiple Vitamins-Minerals (MULTIVITAMIN PO) Take 1 tablet by mouth daily.    [provider]  naproxen sodium (ALEVE) 220 MG tablet Take 220 mg by mouth.    [provider]  rosuvastatin (CRESTOR) 20 MG tablet Take 1 tablet (20 mg total) by mouth daily. 01/30/20 04/29/20  Patwardhan, Reynold Bowen, MD  sertraline (ZOLOFT) 50 MG tablet Take 50 mg by mouth daily.    [provider]   Social History   Socioeconomic History  . Marital status: Legally Separated    Spouse name: Not on file  . Number of children: 2  . Years of education: Not on file  . Highest education level: Not on file  Occupational History  . Not on file  Tobacco Use  . Smoking status: Current Some Day Smoker    Packs/day: 0.25    Years: 35.00    Pack years: 8.75    Types: Cigarettes  . Smokeless tobacco: Never Used  . Tobacco comment: quite 2007 and started again 2014-smokes 3-6 per day  Vaping Use  . Vaping Use:  Never used  Substance and Sexual Activity  . Alcohol use: Yes    Comment: occ  . Drug use: No  . Sexual activity: Not on file  Other Topics Concern  . Not on file  Social History Narrative  . Not on file   Social Determinants of Health   Financial Resource Strain:   . Difficulty of Paying Living Expenses: Not on file  Food Insecurity:   . Worried About Charity fundraiser in the Last Year: Not on file  . Ran Out of Food in the Last Year: Not on file  Transportation Needs:   . Lack of  Transportation (Medical): Not on file  . Lack of Transportation (Non-Medical): Not on file  Physical Activity:   . Days of Exercise per Week: Not on file  . Minutes of Exercise per Session: Not on file  Stress:   . Feeling of Stress : Not on file  Social Connections:   . Frequency of Communication with Friends and Family: Not on file  . Frequency of Social Gatherings with Friends and Family: Not on file  . Attends Religious Services: Not on file  . Active Member of Clubs or Organizations: Not on file  . Attends Archivist Meetings: Not on file  . Marital Status: Not on file   Family History  Problem Relation Age of Onset  . Prostate cancer Father   . Lymphoma Brother   . Breast cancer Maternal Grandmother   . Stroke Paternal Grandmother   . Prostate cancer Brother     ROS: Currently denies lightheadedness, dizziness, Fever, chills, CP, SOB.    No personal history of DVT, PE, MI, or CVA. No loose teeth or dentures All other systems have been reviewed and were otherwise currently negative with the exception of those mentioned in the HPI and as above.  Objective: Vitals: Ht: 61" Wt: 207.6 lbs Temp: 96.9 BP: 156/81 Pulse: 79 O2 97% on room air.   Physical Exam: General: Alert, NAD. Trendelenberg Gait  HEENT: EOMI, Good Neck Extension, Trachea Midline, Head AT/[place] Pulm: No increased work of breathing.  Clear B/L A/P w/o crackle or wheeze.  CV: RRR, No m/g/r appreciated  GI: soft, NT, ND Neuro: Neuro without gross focal deficit.  Sensation intact distally Skin: No lesions in the area of chief complaint MSK/Surgical Site: Right Hip pain with passive ROM.  Positive Stinchfield.  5/5 strength.  NVI.    Imaging Review Plain radiographs demonstrate severe degenerative joint disease of the right hip.   The bone quality appears to be good for age and reported activity level.  Preoperative templating of the joint replacement has been completed, documented, and submitted to  the Operating Room personnel in order to optimize intra-operative equipment management.  Assessment: OA RIGHT HIP Active Problems:   * No active hospital problems. *   Plan: Plan for Procedure(s): TOTAL HIP ARTHROPLASTY ANTERIOR APPROACH  The patient history, physical exam, clinical judgement of the provider and imaging are consistent with end stage degenerative joint disease and total joint arthroplasty is deemed medically necessary. The treatment options including medical management, injection therapy, and arthroplasty were discussed at length. The risks and benefits of Procedure(s): TOTAL HIP ARTHROPLASTY ANTERIOR APPROACH were presented and reviewed.  The risks of nonoperative treatment, versus surgical intervention including but not limited to continued pain, aseptic loosening, stiffness, dislocation/subluxation, infection, bleeding, nerve injury, blood clots, cardiopulmonary complications, morbidity, mortality, among others were discussed. The patient verbalizes understanding and wishes to proceed with the  plan.  Patient is being admitted for surgery, pain control, PT, prophylactic antibiotics, VTE prophylaxis, progressive ambulation, ADL's and discharge planning.   Dental prophylaxis discussed and recommended for 2 years postoperatively.   The patient does meet the criteria for TXA which will be used perioperatively.    ASA 81 mg BID will be used postoperatively for DVT prophylaxis in addition to SCDs, and early ambulation.  Plan for Oxycodone, Celebrex, Tylenol for pain.  Robaxin for spasm.  Zofran for nausea.  The patient is planning to be discharged home with OPPT in care of her spouse.       Rachael Fee, PA-C 02/29/2020 7:45 AM

## 2020-03-07 NOTE — Patient Instructions (Addendum)
DUE TO COVID-19 ONLY ONE VISITOR IS ALLOWED TO COME WITH YOU AND STAY IN THE WAITING ROOM ONLY DURING PRE OP AND PROCEDURE DAY OF SURGERY. THE 1 VISITOR  MAY VISIT WITH YOU AFTER SURGERY IN YOUR PRIVATE ROOM DURING VISITING HOURS ONLY!  YOU NEED TO HAVE A COVID 19 TEST ON: 03/14/20 @ 10:00 AM, THIS TEST MUST BE DONE BEFORE SURGERY,  COVID TESTING SITE [place] [address], IT IS ON THE RIGHT GOING OUT [address] APPROXIMATELY  2 MINUTES PAST [employer] ON THE RIGHT. ONCE YOUR COVID TEST IS COMPLETED,  PLEASE BEGIN THE QUARANTINE INSTRUCTIONS AS OUTLINED IN YOUR HANDOUT.                Wendy Blair    Your procedure is scheduled on: 03/18/20   Report to [facility] Main  Entrance   Report to admitting a: 8:10t AM     Call this number if you have problems the morning of surgery [phone]    Remember:   NO SOLID FOOD AFTER MIDNIGHT THE NIGHT PRIOR TO SURGERY. NOTHING BY MOUTH EXCEPT CLEAR LIQUIDS UNTIL: 7:40 AM . PLEASE FINISH ENSURE DRINK PER SURGEON ORDER  WHICH NEEDS TO BE COMPLETED AT: 7:40 AM .  CLEAR LIQUID DIET   Foods Allowed                                                                     Foods Excluded  Coffee and tea, regular and decaf                             liquids that you cannot  Plain Jell-O any favor except red or purple                                           see through such as: Fruit ices (not with fruit pulp)                                     milk, soups, orange juice  Iced Popsicles                                    All solid food Carbonated beverages, regular and diet                                    Cranberry, grape and apple juices Sports drinks like Gatorade Lightly seasoned clear broth or consume(fat free) Sugar, honey syrup  Sample Menu Breakfast                                Lunch  Supper Cranberry juice                    Beef broth                             Chicken broth Jell-O                                     Grape juice                           Apple juice Coffee or tea                        Jell-O                                      Popsicle                                                Coffee or tea                        Coffee or tea  _____________________________________________________________________   BRUSH YOUR TEETH MORNING OF SURGERY AND RINSE YOUR MOUTH OUT, NO CHEWING GUM CANDY OR MINTS.     Take these medicines the morning of surgery with A SIP OF WATER: amlodipine,cetirizine,synthroid,metoprolol,sertraline.Use inhalers as usual.                                You may not have any metal on your body including hair pins and              piercings  Do not wear jewelry, make-up, lotions, powders or perfumes, deodorant             Do not wear nail polish on your fingernails.  Do not shave  48 hours prior to surgery.            Do not bring valuables to the hospital. [place].  Contacts, dentures or bridgework may not be worn into surgery.  Leave suitcase in the car. After surgery it may be brought to your room.     Patients discharged the day of surgery will not be allowed to drive home. IF YOU ARE HAVING SURGERY AND GOING HOME THE SAME DAY, YOU MUST HAVE AN ADULT TO DRIVE YOU HOME AND BE WITH YOU FOR 24 HOURS. YOU MAY GO HOME BY TAXI OR UBER OR ORTHERWISE, BUT AN ADULT MUST ACCOMPANY YOU HOME AND STAY WITH YOU FOR 24 HOURS.  Name and phone number of your driver:  Special Instructions: N/A              Please read over the following fact sheets you were given: _____________________________________________________________________       [facility] - Preparing for Surgery Before surgery, you can play an important role.  Because skin is not sterile, your skin needs  to be as free of germs as possible.  You can reduce the number of germs on your skin by washing with  CHG (chlorahexidine gluconate) soap before surgery.  CHG is an antiseptic cleaner which kills germs and bonds with the skin to continue killing germs even after washing. Please DO NOT use if you have an allergy to CHG or antibacterial soaps.  If your skin becomes reddened/irritated stop using the CHG and inform your nurse when you arrive at Short Stay. Do not shave (including legs and underarms) for at least 48 hours prior to the first CHG shower.  You may shave your face/neck. Please follow these instructions carefully:  1.  Shower with CHG Soap the night before surgery and the  morning of Surgery.  2.  If you choose to wash your hair, wash your hair first as usual with your  normal  shampoo.  3.  After you shampoo, rinse your hair and body thoroughly to remove the  shampoo.                           4.  Use CHG as you would any other liquid soap.  You can apply chg directly  to the skin and wash                       Gently with a scrungie or clean washcloth.  5.  Apply the CHG Soap to your body ONLY FROM THE NECK DOWN.   Do not use on face/ open                           Wound or open sores. Avoid contact with eyes, ears mouth and genitals (private parts).                       Wash face,  Genitals (private parts) with your normal soap.             6.  Wash thoroughly, paying special attention to the area where your surgery  will be performed.  7.  Thoroughly rinse your body with warm water from the neck down.  8.  DO NOT shower/wash with your normal soap after using and rinsing off  the CHG Soap.                9.  Pat yourself dry with a clean towel.            10.  Wear clean pajamas.            11.  Place clean sheets on your bed the night of your first shower and do not  sleep with pets. Day of Surgery : Do not apply any lotions/deodorants the morning of surgery.  Please wear clean clothes to the hospital/surgery center.  FAILURE TO FOLLOW THESE INSTRUCTIONS MAY RESULT IN THE CANCELLATION  OF YOUR SURGERY PATIENT SIGNATURE_________________________________  NURSE SIGNATURE__________________________________  ________________________________________________________________________   Wendy Blair  An incentive spirometer is a tool that can help keep your lungs clear and active. This tool measures how well you are filling your lungs with each breath. Taking long deep breaths may help reverse or decrease the chance of developing breathing (pulmonary) problems (especially infection) following:  A long period of time when you are unable to move or be active. BEFORE THE PROCEDURE   If the spirometer includes an indicator to show  your best effort, your nurse or respiratory therapist will set it to a desired goal.  If possible, sit up straight or lean slightly forward. Try not to slouch.  Hold the incentive spirometer in an upright position. INSTRUCTIONS FOR USE  1. Sit on the edge of your bed if possible, or sit up as far as you can in bed or on a chair. 2. Hold the incentive spirometer in an upright position. 3. Breathe out normally. 4. Place the mouthpiece in your mouth and seal your lips tightly around it. 5. Breathe in slowly and as deeply as possible, raising the piston or the ball toward the top of the column. 6. Hold your breath for 3-5 seconds or for as long as possible. Allow the piston or ball to fall to the bottom of the column. 7. Remove the mouthpiece from your mouth and breathe out normally. 8. Rest for a few seconds and repeat Steps 1 through 7 at least 10 times every 1-2 hours when you are awake. Take your time and take a few normal breaths between deep breaths. 9. The spirometer may include an indicator to show your best effort. Use the indicator as a goal to work toward during each repetition. 10. After each set of 10 deep breaths, practice coughing to be sure your lungs are clear. If you have an incision (the cut made at the time of surgery), support your  incision when coughing by placing a pillow or rolled up towels firmly against it. Once you are able to get out of bed, walk around indoors and cough well. You may stop using the incentive spirometer when instructed by your caregiver.  RISKS AND COMPLICATIONS  Take your time so you do not get dizzy or light-headed.  If you are in pain, you may need to take or ask for pain medication before doing incentive spirometry. It is harder to take a deep breath if you are having pain. AFTER USE  Rest and breathe slowly and easily.  It can be helpful to keep track of a log of your progress. Your caregiver can provide you with a simple table to help with this. If you are using the spirometer at home, follow these instructions: [place] IF:   You are having difficultly using the spirometer.  You have trouble using the spirometer as often as instructed.  Your pain medication is not giving enough relief while using the spirometer.  You develop fever of 100.5 F (38.1 C) or higher. SEEK IMMEDIATE MEDICAL CARE IF:   You cough up bloody sputum that had not been present before.  You develop fever of 102 F (38.9 C) or greater.  You develop worsening pain at or near the incision site. MAKE SURE YOU:   Understand these instructions.  Will watch your condition.  Will get help right away if you are not doing well or get worse. Document Released: 09/13/2006 Document Revised: 07/26/2011 Document Reviewed: 11/14/2006 [facility] Patient Information 2014 [place], [place].   ________________________________________________________________________

## 2020-03-10 ENCOUNTER — Encounter (HOSPITAL_COMMUNITY): Payer: Self-pay

## 2020-03-10 ENCOUNTER — Other Ambulatory Visit: Payer: Self-pay

## 2020-03-10 ENCOUNTER — Encounter (HOSPITAL_COMMUNITY)
Admission: RE | Admit: 2020-03-10 | Discharge: 2020-03-10 | Disposition: A | Discharge status: Home / Self Care | Payer: Medicare Other | Source: Ambulatory Visit | Attending: Orthopedic Surgery | Admitting: Orthopedic Surgery

## 2020-03-10 DIAGNOSIS — Z01812 Encounter for preprocedural laboratory examination: Secondary | ICD-10-CM | POA: Insufficient documentation

## 2020-03-10 HISTORY — DX: Unspecified osteoarthritis, unspecified site: M19.90

## 2020-03-10 HISTORY — DX: Chronic obstructive pulmonary disease, unspecified: J44.9

## 2020-03-10 LAB — BASIC METABOLIC PANEL
Anion gap: 13 (ref 5–15)
BUN: 15 mg/dL (ref 8–23)
CO2: 27 mmol/L (ref 22–32)
Calcium: 9.7 mg/dL (ref 8.9–10.3)
Chloride: 104 mmol/L (ref 98–111)
Creatinine, Ser: 0.56 mg/dL (ref 0.44–1.00)
GFR, Estimated: >60 mL/min (ref >60)
Glucose, Bld: 96 mg/dL (ref 70–99)
Potassium: 4.1 mmol/L (ref 3.5–5.1)
Sodium: 144 mmol/L (ref 135–145)

## 2020-03-10 LAB — CBC
HCT: 44.5 % (ref 36.0–46.0)
Hemoglobin: 14.2 g/dL (ref 12.0–15.0)
MCH: 30.1 pg (ref 26.0–34.0)
MCHC: 31.9 g/dL (ref 30.0–36.0)
MCV: 94.5 fL (ref 80.0–100.0)
Platelets: 215 10*3/uL (ref 150–400)
RBC: 4.71 MIL/uL (ref 3.87–5.11)
RDW: 12.5 % (ref 11.5–15.5)
WBC: 7.2 10*3/uL (ref 4.0–10.5)
nRBC: 0 % (ref 0.0–0.2)

## 2020-03-10 LAB — PROTIME-INR
INR: 1 (ref 0.8–1.2)
Prothrombin Time: 12.5 seconds (ref 11.4–15.2)

## 2020-03-10 LAB — SURGICAL PCR SCREEN
MRSA, PCR: NEGATIVE
Staphylococcus aureus: NEGATIVE

## 2020-03-10 NOTE — Progress Notes (Addendum)
COVID Vaccine Completed: Yes  Date COVID Vaccine completed: 08/14/19. [place]  COVID vaccine manufacturer: Pfizer     PCP - Dr. Jonathon Jordan. Cardiologist - Dr Joya Gaskins Patwardhan.:Clearance: 01/30/20. EPIC  Chest x-ray -  EKG - 01/30/20. EPIC Stress Test -  ECHO - 02/07/20 Cardiac Cath -  Pacemaker/ICD device last checked:  Sleep Study -  CPAP -   Fasting Blood Sugar -  Checks Blood Sugar _____ times a day  Blood Thinner Instructions: Aspirin Instructions: Last Dose:  Anesthesia review: Hx: HTN.  Patient denies shortness of breath, fever, cough and chest pain at PAT appointment   Patient verbalized understanding of instructions that were given to them at the PAT appointment. Patient was also instructed that they will need to review over the PAT instructions again at home before surgery.

## 2020-03-12 ENCOUNTER — Other Ambulatory Visit (HOSPITAL_COMMUNITY): Payer: Self-pay | Admitting: Physician Assistant

## 2020-03-12 MED FILL — LEVOTHYROXINE 50 MCG TABLET: 50 | 90 days supply | Qty: 90 | Fill #1

## 2020-03-12 MED FILL — ROSUVASTATIN CALCIUM 20 MG: 20 | 90 days supply | Qty: 90 | Fill #0

## 2020-03-12 MED FILL — METHOCARBAMOL 750 MG TABS: 750 | 7 days supply | Qty: 20 | Fill #0

## 2020-03-12 MED FILL — CELECOXIB 200 MG CAP: 200 | 14 days supply | Qty: 28 | Fill #0

## 2020-03-12 MED FILL — oxyCODONE HCL 5 MG TABS: 5 | 5 days supply | Qty: 30 | Fill #0

## 2020-03-12 MED FILL — ONDANSETRON HCL 4 MG TABS: 4 | 7 days supply | Qty: 20 | Fill #0

## 2020-03-14 ENCOUNTER — Other Ambulatory Visit (HOSPITAL_COMMUNITY)
Admission: RE | Admit: 2020-03-14 | Discharge: 2020-03-14 | Disposition: A | Discharge status: Home / Self Care | Payer: Medicare Other | Source: Ambulatory Visit | Attending: Orthopedic Surgery | Admitting: Orthopedic Surgery

## 2020-03-14 DIAGNOSIS — Z01812 Encounter for preprocedural laboratory examination: Secondary | ICD-10-CM | POA: Insufficient documentation

## 2020-03-14 DIAGNOSIS — Z20822 Contact with and (suspected) exposure to covid-19: Secondary | ICD-10-CM | POA: Insufficient documentation

## 2020-03-15 LAB — SARS CORONAVIRUS 2 (TAT 6-24 HRS): SARS Coronavirus 2: NEGATIVE

## 2020-03-18 ENCOUNTER — Ambulatory Visit (HOSPITAL_COMMUNITY): Payer: Medicare Other

## 2020-03-18 ENCOUNTER — Ambulatory Visit (HOSPITAL_COMMUNITY): Payer: Medicare Other | Admitting: Certified Registered"

## 2020-03-18 ENCOUNTER — Encounter (HOSPITAL_COMMUNITY)
Admission: RE | Disposition: A | Discharge status: Home / Self Care | Payer: Self-pay | Source: Home / Self Care | Attending: Orthopedic Surgery

## 2020-03-18 ENCOUNTER — Encounter (HOSPITAL_COMMUNITY): Payer: Self-pay | Admitting: Orthopedic Surgery

## 2020-03-18 ENCOUNTER — Ambulatory Visit (HOSPITAL_COMMUNITY)
Admission: RE | Admit: 2020-03-18 | Discharge: 2020-03-18 | Disposition: A | Discharge status: Home / Self Care | Payer: Medicare Other | Attending: Orthopedic Surgery | Admitting: Orthopedic Surgery

## 2020-03-18 DIAGNOSIS — Z419 Encounter for procedure for purposes other than remedying health state, unspecified: Secondary | ICD-10-CM

## 2020-03-18 DIAGNOSIS — M1611 Unilateral primary osteoarthritis, right hip: Secondary | ICD-10-CM | POA: Insufficient documentation

## 2020-03-18 HISTORY — PX: TOTAL HIP ARTHROPLASTY: SHX124

## 2020-03-18 SURGERY — ARTHROPLASTY, HIP, TOTAL, ANTERIOR APPROACH
Anesthesia: Monitor Anesthesia Care | Site: Hip | Laterality: Right

## 2020-03-18 MED ORDER — MEPIVACAINE HCL (PF) 2 % IJ SOLN
INTRAMUSCULAR | Status: DC | PRN
  Administered 2020-03-18: 3 mL via EPIDURAL

## 2020-03-18 MED ORDER — CEFAZOLIN SODIUM-DEXTROSE 2-4 GM/100ML-% IV SOLN
INTRAVENOUS | Status: AC
  Filled 2020-03-18: qty 100

## 2020-03-18 MED ORDER — ORAL CARE MOUTH RINSE: 15.0000 mL | Freq: Once | OROMUCOSAL | Status: AC

## 2020-03-18 MED ORDER — ONDANSETRON HCL 4 MG PO TABS: 4.0000 mg | ORAL_TABLET | Freq: Every day | ORAL | 0 refills | Status: AC | PRN

## 2020-03-18 MED ORDER — TRANEXAMIC ACID-NACL 1000-0.7 MG/100ML-% IV SOLN
1000.0000 mg | INTRAVENOUS | Status: AC
  Administered 2020-03-18: 1000 mg via INTRAVENOUS
  Filled 2020-03-18: qty 100

## 2020-03-18 MED ORDER — MIDAZOLAM HCL 2 MG/2ML IJ SOLN
INTRAMUSCULAR | Status: DC | PRN
  Administered 2020-03-18: 2 mg via INTRAVENOUS

## 2020-03-18 MED ORDER — DOCUSATE SODIUM 100 MG PO CAPS: 100.0000 mg | ORAL_CAPSULE | Freq: Two times a day (BID) | ORAL | Status: DC

## 2020-03-18 MED ORDER — PROPOFOL 10 MG/ML IV BOLUS
INTRAVENOUS | Status: DC | PRN
  Administered 2020-03-18: 20 mg via INTRAVENOUS

## 2020-03-18 MED ORDER — FENTANYL CITRATE (PF) 100 MCG/2ML IJ SOLN
25.0000 ug | INTRAMUSCULAR | Status: DC | PRN
  Administered 2020-03-18: 25 ug via INTRAVENOUS
  Administered 2020-03-18: 50 ug via INTRAVENOUS
  Administered 2020-03-18: 25 ug via INTRAVENOUS

## 2020-03-18 MED ORDER — FENTANYL CITRATE (PF) 100 MCG/2ML IJ SOLN
INTRAMUSCULAR | Status: AC
  Filled 2020-03-18: qty 2

## 2020-03-18 MED ORDER — ONDANSETRON HCL 4 MG PO TABS
4.0000 mg | ORAL_TABLET | Freq: Four times a day (QID) | ORAL | Status: DC | PRN
  Filled 2020-03-18: qty 1

## 2020-03-18 MED ORDER — OXYCODONE HCL 5 MG PO TABS
ORAL_TABLET | ORAL | Status: AC
  Filled 2020-03-18: qty 2

## 2020-03-18 MED ORDER — DEXAMETHASONE SODIUM PHOSPHATE 10 MG/ML IJ SOLN
INTRAMUSCULAR | Status: DC | PRN
  Administered 2020-03-18: 8 mg via INTRAVENOUS

## 2020-03-18 MED ORDER — BISACODYL 10 MG RE SUPP: 10.0000 mg | Freq: Every day | RECTAL | Status: DC | PRN

## 2020-03-18 MED ORDER — PROPOFOL 10 MG/ML IV BOLUS
INTRAVENOUS | Status: AC
  Filled 2020-03-18: qty 20

## 2020-03-18 MED ORDER — LACTATED RINGERS IV BOLUS
250.0000 mL | Freq: Once | INTRAVENOUS | Status: AC
  Administered 2020-03-18: 250 mL via INTRAVENOUS

## 2020-03-18 MED ORDER — AMISULPRIDE (ANTIEMETIC) 5 MG/2ML IV SOLN: 10.0000 mg | Freq: Once | INTRAVENOUS | Status: DC | PRN

## 2020-03-18 MED ORDER — PHENYLEPHRINE 40 MCG/ML (10ML) SYRINGE FOR IV PUSH (FOR BLOOD PRESSURE SUPPORT)
PREFILLED_SYRINGE | INTRAVENOUS | Status: DC | PRN
  Administered 2020-03-18: 80 ug via INTRAVENOUS

## 2020-03-18 MED ORDER — ONDANSETRON HCL 4 MG/2ML IJ SOLN
INTRAMUSCULAR | Status: AC
  Filled 2020-03-18: qty 2

## 2020-03-18 MED ORDER — WATER FOR IRRIGATION, STERILE IR SOLN
Status: DC | PRN
  Administered 2020-03-18: 2000 mL

## 2020-03-18 MED ORDER — DIPHENHYDRAMINE HCL 50 MG/ML IJ SOLN
INTRAMUSCULAR | Status: AC
  Filled 2020-03-18: qty 1

## 2020-03-18 MED ORDER — EPHEDRINE SULFATE-NACL 50-0.9 MG/10ML-% IV SOSY
PREFILLED_SYRINGE | INTRAVENOUS | Status: DC | PRN
  Administered 2020-03-18: 5 mg via INTRAVENOUS
  Administered 2020-03-18 (×2): 10 mg via INTRAVENOUS

## 2020-03-18 MED ORDER — METOCLOPRAMIDE HCL 5 MG PO TABS: 5.0000 mg | ORAL_TABLET | Freq: Three times a day (TID) | ORAL | Status: DC | PRN

## 2020-03-18 MED ORDER — BUPIVACAINE LIPOSOME 1.3 % IJ SUSP
10.0000 mL | Freq: Once | INTRAMUSCULAR | Status: AC
  Administered 2020-03-18: 10 mL
  Filled 2020-03-18: qty 10

## 2020-03-18 MED ORDER — OXYCODONE HCL 5 MG PO TABS
5.0000 mg | ORAL_TABLET | Freq: Three times a day (TID) | ORAL | 0 refills | Status: DC | PRN
Start: 2020-03-18 — End: 2020-07-01

## 2020-03-18 MED ORDER — PROPOFOL 1000 MG/100ML IV EMUL
INTRAVENOUS | Status: AC
  Filled 2020-03-18: qty 100

## 2020-03-18 MED ORDER — ASPIRIN EC 81 MG PO TBEC: 81.0000 mg | DELAYED_RELEASE_TABLET | Freq: Every day | ORAL | 0 refills | Status: AC

## 2020-03-18 MED ORDER — METHOCARBAMOL 500 MG PO TABS: 500.0000 mg | ORAL_TABLET | Freq: Four times a day (QID) | ORAL | 0 refills | Status: AC

## 2020-03-18 MED ORDER — PROPOFOL 500 MG/50ML IV EMUL
INTRAVENOUS | Status: AC
  Filled 2020-03-18: qty 50

## 2020-03-18 MED ORDER — EPHEDRINE 5 MG/ML INJ
INTRAVENOUS | Status: AC
  Filled 2020-03-18: qty 20

## 2020-03-18 MED ORDER — LACTATED RINGERS IV BOLUS
500.0000 mL | Freq: Once | INTRAVENOUS | Status: AC
  Administered 2020-03-18: 500 mL via INTRAVENOUS

## 2020-03-18 MED ORDER — FENTANYL CITRATE (PF) 100 MCG/2ML IJ SOLN
INTRAMUSCULAR | Status: DC | PRN
  Administered 2020-03-18: 25 ug via INTRAVENOUS
  Administered 2020-03-18: 50 ug via INTRAVENOUS
  Administered 2020-03-18: 25 ug via INTRAVENOUS
  Administered 2020-03-18 (×2): 50 ug via INTRAVENOUS

## 2020-03-18 MED ORDER — PHENYLEPHRINE 40 MCG/ML (10ML) SYRINGE FOR IV PUSH (FOR BLOOD PRESSURE SUPPORT)
PREFILLED_SYRINGE | INTRAVENOUS | Status: AC
  Filled 2020-03-18: qty 10

## 2020-03-18 MED ORDER — ASPIRIN 81 MG PO CHEW: 81.0000 mg | CHEWABLE_TABLET | Freq: Two times a day (BID) | ORAL | Status: DC

## 2020-03-18 MED ORDER — DEXAMETHASONE SODIUM PHOSPHATE 10 MG/ML IJ SOLN
INTRAMUSCULAR | Status: AC
  Filled 2020-03-18: qty 1

## 2020-03-18 MED ORDER — LACTATED RINGERS IV SOLN: INTRAVENOUS | Status: DC

## 2020-03-18 MED ORDER — CEFAZOLIN SODIUM-DEXTROSE 2-4 GM/100ML-% IV SOLN
2.0000 g | Freq: Four times a day (QID) | INTRAVENOUS | Status: DC
  Administered 2020-03-18: 2 g via INTRAVENOUS

## 2020-03-18 MED ORDER — METHOCARBAMOL 500 MG PO TABS: 500.0000 mg | ORAL_TABLET | Freq: Four times a day (QID) | ORAL | Status: DC | PRN

## 2020-03-18 MED ORDER — PHENYLEPHRINE HCL-NACL 10-0.9 MG/250ML-% IV SOLN
INTRAVENOUS | Status: DC | PRN
  Administered 2020-03-18: 40 ug/min via INTRAVENOUS

## 2020-03-18 MED ORDER — TRANEXAMIC ACID-NACL 1000-0.7 MG/100ML-% IV SOLN
1000.0000 mg | Freq: Once | INTRAVENOUS | Status: AC
  Administered 2020-03-18: 1000 mg via INTRAVENOUS

## 2020-03-18 MED ORDER — ACETAMINOPHEN 500 MG PO TABS: 1000.0000 mg | ORAL_TABLET | Freq: Four times a day (QID) | ORAL | 0 refills | Status: AC | PRN

## 2020-03-18 MED ORDER — OXYCODONE HCL 5 MG PO TABS: 5.0000 mg | ORAL_TABLET | ORAL | Status: DC | PRN

## 2020-03-18 MED ORDER — 0.9 % SODIUM CHLORIDE (POUR BTL) OPTIME
TOPICAL | Status: DC | PRN
  Administered 2020-03-18: 1000 mL

## 2020-03-18 MED ORDER — ONDANSETRON HCL 4 MG/2ML IJ SOLN
INTRAMUSCULAR | Status: DC | PRN
  Administered 2020-03-18: 4 mg via INTRAVENOUS

## 2020-03-18 MED ORDER — METOCLOPRAMIDE HCL 5 MG/ML IJ SOLN: 5.0000 mg | Freq: Three times a day (TID) | INTRAMUSCULAR | Status: DC | PRN

## 2020-03-18 MED ORDER — CEFAZOLIN SODIUM-DEXTROSE 2-4 GM/100ML-% IV SOLN
2.0000 g | INTRAVENOUS | Status: AC
  Administered 2020-03-18: 2 g via INTRAVENOUS
  Filled 2020-03-18: qty 100

## 2020-03-18 MED ORDER — METHOCARBAMOL 500 MG IVPB - SIMPLE MED
500.0000 mg | Freq: Four times a day (QID) | INTRAVENOUS | Status: DC | PRN
  Administered 2020-03-18: 500 mg via INTRAVENOUS

## 2020-03-18 MED ORDER — CELECOXIB 200 MG PO CAPS: 200.0000 mg | ORAL_CAPSULE | Freq: Two times a day (BID) | ORAL | Status: DC

## 2020-03-18 MED ORDER — PHENOL 1.4 % MT LIQD: 1.0000 | OROMUCOSAL | Status: DC | PRN

## 2020-03-18 MED ORDER — MIDAZOLAM HCL 2 MG/2ML IJ SOLN
INTRAMUSCULAR | Status: AC
  Filled 2020-03-18: qty 2

## 2020-03-18 MED ORDER — CHLORHEXIDINE GLUCONATE 0.12 % MT SOLN
15.0000 mL | Freq: Once | OROMUCOSAL | Status: AC
  Administered 2020-03-18: 15 mL via OROMUCOSAL

## 2020-03-18 MED ORDER — LIDOCAINE 2% (20 MG/ML) 5 ML SYRINGE
INTRAMUSCULAR | Status: AC
  Filled 2020-03-18: qty 5

## 2020-03-18 MED ORDER — MENTHOL 3 MG MT LOZG: 1.0000 | LOZENGE | OROMUCOSAL | Status: DC | PRN

## 2020-03-18 MED ORDER — ACETAMINOPHEN 500 MG PO TABS
1000.0000 mg | ORAL_TABLET | Freq: Once | ORAL | Status: AC
  Administered 2020-03-18: 1000 mg via ORAL
  Filled 2020-03-18: qty 2

## 2020-03-18 MED ORDER — PROPOFOL 500 MG/50ML IV EMUL
INTRAVENOUS | Status: DC | PRN
  Administered 2020-03-18: 75 ug/kg/min via INTRAVENOUS

## 2020-03-18 MED ORDER — CEFAZOLIN SODIUM-DEXTROSE 1-4 GM/50ML-% IV SOLN
INTRAVENOUS | Status: AC
  Filled 2020-03-18: qty 50

## 2020-03-18 MED ORDER — SODIUM CHLORIDE (PF) 0.9 % IJ SOLN
INTRAMUSCULAR | Status: AC
  Filled 2020-03-18: qty 20

## 2020-03-18 MED ORDER — DIPHENHYDRAMINE HCL 50 MG/ML IJ SOLN
25.0000 mg | Freq: Once | INTRAMUSCULAR | Status: AC
  Administered 2020-03-18 (×2): 12.5 mg via INTRAVENOUS

## 2020-03-18 MED ORDER — CELECOXIB 200 MG PO CAPS: 200.0000 mg | ORAL_CAPSULE | Freq: Once | ORAL | Status: DC

## 2020-03-18 MED ORDER — ONDANSETRON HCL 4 MG/2ML IJ SOLN: 4.0000 mg | Freq: Four times a day (QID) | INTRAMUSCULAR | Status: DC | PRN

## 2020-03-18 MED ORDER — FENTANYL CITRATE (PF) 100 MCG/2ML IJ SOLN
INTRAMUSCULAR | Status: AC
  Administered 2020-03-18: 50 ug via INTRAVENOUS
  Filled 2020-03-18: qty 2

## 2020-03-18 MED ORDER — SODIUM CHLORIDE FLUSH 0.9 % IV SOLN
INTRAVENOUS | Status: DC | PRN
  Administered 2020-03-18: 20 mL via INTRAVENOUS

## 2020-03-18 MED ORDER — SENNOSIDES-DOCUSATE SODIUM 8.6-50 MG PO TABS: 1.0000 | ORAL_TABLET | Freq: Every evening | ORAL | Status: DC | PRN

## 2020-03-18 MED ORDER — TRANEXAMIC ACID-NACL 1000-0.7 MG/100ML-% IV SOLN
INTRAVENOUS | Status: AC
  Filled 2020-03-18: qty 100

## 2020-03-18 MED ORDER — CELECOXIB 200 MG PO CAPS: 200.0000 mg | ORAL_CAPSULE | Freq: Two times a day (BID) | ORAL | 0 refills | Status: AC

## 2020-03-18 MED ORDER — ACETAMINOPHEN 325 MG PO TABS: 325.0000 mg | ORAL_TABLET | Freq: Four times a day (QID) | ORAL | Status: DC | PRN

## 2020-03-18 MED ORDER — OXYCODONE HCL 5 MG PO TABS
10.0000 mg | ORAL_TABLET | ORAL | Status: DC | PRN
  Administered 2020-03-18: 10 mg via ORAL

## 2020-03-18 MED ORDER — METHOCARBAMOL 500 MG IVPB - SIMPLE MED
INTRAVENOUS | Status: AC
  Filled 2020-03-18: qty 50

## 2020-03-18 MED ORDER — HYDROMORPHONE HCL 1 MG/ML IJ SOLN: 0.5000 mg | INTRAMUSCULAR | Status: DC | PRN

## 2020-03-18 MED FILL — oxyCODONE HCL 5 MG TABS: 5 | 6 days supply | Qty: 20 | Fill #0

## 2020-03-18 MED FILL — CELECOXIB 200 MG CAP: 200 | 14 days supply | Qty: 28 | Fill #0

## 2020-03-18 MED FILL — ONDANSETRON HCL 4 MG TABS: 4 | 30 days supply | Qty: 30 | Fill #0

## 2020-03-18 MED FILL — METHOCARBAMOL 500 MG TABS: 500 | 5 days supply | Qty: 20 | Fill #0

## 2020-03-18 MED FILL — ASPIRIN LOW DOSE 81 MG TBEC: 81 | 60 days supply | Qty: 60 | Fill #0

## 2020-03-18 SURGICAL SUPPLY — 45 items
APL PRP STRL LF DISP 70% ISPRP (MISCELLANEOUS) ×1
BAG SPEC THK2 15X12 ZIP CLS (MISCELLANEOUS)
BAG ZIPLOCK 12X15 (MISCELLANEOUS) IMPLANT
BLADE SAG 18X100X1.27 (BLADE) ×2 IMPLANT
BLADE SURG SZ10 CARB STEEL (BLADE) IMPLANT
CHLORAPREP W/TINT 26 (MISCELLANEOUS) ×2 IMPLANT
CLSR STERI-STRIP ANTIMIC 1/2X4 (GAUZE/BANDAGES/DRESSINGS) ×2 IMPLANT
COVER PERINEAL POST (MISCELLANEOUS) ×2 IMPLANT
COVER SURGICAL LIGHT HANDLE (MISCELLANEOUS) ×2 IMPLANT
COVER WAND RF STERILE (DRAPES) IMPLANT
DECANTER SPIKE VIAL GLASS SM (MISCELLANEOUS) ×4 IMPLANT
DRAPE IMP U-DRAPE 54X76 (DRAPES) ×2 IMPLANT
DRAPE STERI IOBAN 125X83 (DRAPES) ×2 IMPLANT
DRAPE U-SHAPE 47X51 STRL (DRAPES) ×4 IMPLANT
DRSG MEPILEX BORDER 4X8 (GAUZE/BANDAGES/DRESSINGS) ×2 IMPLANT
ELECT REM PT RETURN 15FT ADLT (MISCELLANEOUS) ×2 IMPLANT
GLOVE BIO SURGEON STRL SZ7.5 (GLOVE) ×4 IMPLANT
GLOVE BIOGEL PI IND STRL 7.5 (GLOVE) ×1 IMPLANT
GLOVE BIOGEL PI IND STRL 8 (GLOVE) ×1 IMPLANT
GLOVE BIOGEL PI INDICATOR 7.5 (GLOVE) ×1
GLOVE BIOGEL PI INDICATOR 8 (GLOVE) ×1
GOWN STRL REUS W/TWL LRG LVL3 (GOWN DISPOSABLE) ×2 IMPLANT
GOWN STRL REUS W/TWL XL LVL3 (GOWN DISPOSABLE) ×2 IMPLANT
HEAD BIOLOX HIP 36/-5 (Joint) ×1 IMPLANT
HIP BIOLOX HD 36/-5 (Joint) ×2 IMPLANT
HOLDER FOLEY CATH W/STRAP (MISCELLANEOUS) IMPLANT
INSERT 0 DEGREE 36 (Miscellaneous) ×1 IMPLANT
KIT TURNOVER KIT A (KITS) IMPLANT
MANIFOLD NEPTUNE II (INSTRUMENTS) ×2 IMPLANT
NS IRRIG 1000ML POUR BTL (IV SOLUTION) ×2 IMPLANT
PACK ANTERIOR HIP CUSTOM (KITS) ×2 IMPLANT
PROTECTOR NERVE ULNAR (MISCELLANEOUS) ×2 IMPLANT
SCREW HEX LP 6.5X20 (Screw) ×1 IMPLANT
SHELL ACETAB TRIDENT 48 (Shell) ×1 IMPLANT
STEM HIP 4 127DEG (Stem) ×1 IMPLANT
SUT MNCRL AB 3-0 PS2 18 (SUTURE) ×2 IMPLANT
SUT STRATAFIX 0 PDS 27 VIOLET (SUTURE) ×2
SUT VIC AB 0 CT1 36 (SUTURE) ×2 IMPLANT
SUT VIC AB 1 CT1 36 (SUTURE) ×2 IMPLANT
SUT VIC AB 2-0 CT1 27 (SUTURE) ×4
SUT VIC AB 2-0 CT1 TAPERPNT 27 (SUTURE) ×2 IMPLANT
SUTURE STRATFX 0 PDS 27 VIOLET (SUTURE) ×1 IMPLANT
TRAY FOLEY MTR SLVR 16FR STAT (SET/KITS/TRAYS/PACK) IMPLANT
TUBE SUCTION HIGH CAP CLEAR NV (SUCTIONS) ×2 IMPLANT
WATER STERILE IRR 1000ML POUR (IV SOLUTION) ×4 IMPLANT

## 2020-03-18 NOTE — Evaluation (Signed)
Physical Therapy Evaluation Patient Details Name: Wendy Blair MRN: [id number] DOB: [birth year] Today's Date: 03/18/2020   History of Present Illness  69 yo female s/p R THA-direct anterior 03/18/2020.  Clinical Impression  On eval POD 0, pt was Min guard assist for mobility. She walked ~75 feet around the unit and practiced stair negotiation. Moderate pain with activity. Discussed d/c plan-pt will d/c home with assistance from her partner Max. Encouraged pt to ambulate as tolerated with use of RW. Per pt, she will continue therapy with HHPT. All education completed. Okay to d/c from PT standpoint.     Follow Up Recommendations Follow surgeon's recommendation for DC plan and follow-up therapies;Supervision/Assistance - 24 hour (HHPT per pt)    Equipment Recommendations  Rolling walker with 5" wheels (issued RW on today)    Recommendations for Other Services       Precautions / Restrictions Precautions Precautions: Fall Restrictions Weight Bearing Restrictions: No Other Position/Activity Restrictions: WBAT      Mobility  Bed Mobility Overal bed mobility: Needs Assistance Bed Mobility: Supine to Sit     Supine to sit: Min guard;HOB elevated     General bed mobility comments: Min guard for safety. Increased time.    Transfers Overall transfer level: Needs assistance Equipment used: Rolling walker (2 wheeled) Transfers: Sit to/from Stand Sit to Stand: Min guard;From elevated surface         General transfer comment: VCs safety, technique, hand placement. Min guard for safety  Ambulation/Gait Ambulation/Gait assistance: Min guard Gait Distance (Feet): 75 Feet Assistive device: Rolling walker (2 wheeled) Gait Pattern/deviations: Step-through pattern;Decreased stride length     General Gait Details: Min guard for safety. Slow gait speed. VCs safety, technique, sequence, posture, RW proximity. Pt denied lightheadedness.   Stairs Stairs: Yes Min guard Assist    Number of Stairs: 2 General stair comments: Up and over portable stairs x 1. Cues for safety, sequence, technique.  Verbally educated on proper technique for 1 step with RW.  Wheelchair Mobility    Modified Rankin (Stroke Patients Only)       Balance Overall balance assessment: Needs assistance         Standing balance support: Bilateral upper extremity supported Standing balance-Leahy Scale: Fair                               Pertinent Vitals/Pain Pain Assessment: 0-10 Pain Score: 6  Pain Location: R hip Pain Descriptors / Indicators: Discomfort;Sore;Sharp Pain Intervention(s): Limited activity within patient's tolerance;Monitored during session;Repositioned;Ice applied    Home Living Family/patient expects to be discharged to:: Private residence Living Arrangements:  (Max) Available Help at Discharge: Friend(s) Type of Home: House Home Access: Stairs to enter   Technical brewer of Steps: 1 Home Layout: Able to live on main level with bedroom/bathroom Home Equipment: Bedside commode;Cane - single point      Prior Function Level of Independence: Independent with assistive device(s)         Comments: using cane for ambulation prior to surgery.     Hand Dominance        Extremity/Trunk Assessment   Upper Extremity Assessment Upper Extremity Assessment: Overall WFL for tasks assessed    Lower Extremity Assessment Lower Extremity Assessment: Generalized weakness (post op weakness R LE 2* THA)    Cervical / Trunk Assessment Cervical / Trunk Assessment: Normal  Communication   Communication: No difficulties  Cognition Arousal/Alertness: Awake/alert (a bit drowsy) Behavior  During Therapy: WFL for tasks assessed/performed Overall Cognitive Status: Within Functional Limits for tasks assessed                                        General Comments      Exercises     Assessment/Plan    PT Assessment All further  PT needs can be met in the next venue of care (HHPT f/u)  PT Problem List Decreased strength;Decreased mobility;Decreased range of motion;Decreased activity tolerance;Decreased balance;Decreased knowledge of use of DME;Pain       PT Treatment Interventions      PT Goals (Current goals can be found in the Care Plan section)  Acute Rehab PT Goals Patient Stated Goal: regain PLOF/independence. Less pain. PT Goal Formulation: All assessment and education complete, DC therapy    Frequency     Barriers to discharge        Co-evaluation               AM-PAC PT "6 Clicks" Mobility  Outcome Measure Help needed turning from your back to your side while in a flat bed without using bedrails?: A Little Help needed moving from lying on your back to sitting on the side of a flat bed without using bedrails?: A Little Help needed moving to and from a bed to a chair (including a wheelchair)?: A Little Help needed standing up from a chair using your arms (e.g., wheelchair or bedside chair)?: A Little Help needed to walk in hospital room?: A Little Help needed climbing 3-5 steps with a railing? : A Little 6 Click Score: 18    End of Session Equipment Utilized During Treatment: Gait belt Activity Tolerance: Patient tolerated treatment well Patient left: in chair;with call bell/phone within reach   PT Visit Diagnosis: Pain;Other abnormalities of gait and mobility (R26.89) Pain - Right/Left: Right Pain - part of body: Hip    Time: 3016-0109 PT Time Calculation (min) (ACUTE ONLY): 33 min   Charges:   PT Evaluation $PT Eval Low Complexity: 1 Low PT Treatments $Gait Training: 8-22 mins          Doreatha Massed, PT Acute Rehabilitation  Office: [phone] Pager: [phone]

## 2020-03-18 NOTE — Transfer of Care (Signed)
Immediate Anesthesia Transfer of Care Note  Patient: Wendy Blair  Procedure(s) Performed: TOTAL HIP ARTHROPLASTY ANTERIOR APPROACH (Right Hip)  Patient Location: PACU  Anesthesia Type:Spinal  Level of Consciousness: awake, alert  and patient cooperative  Airway & Oxygen Therapy: Patient Spontanous Breathing and Patient connected to face mask oxygen  Post-op Assessment: Report given to RN and Post -op Vital signs reviewed and stable  Post vital signs: Reviewed and stable  Last Vitals:  Vitals Value Taken Time  BP 97/79 03/18/20 1312  Temp    Pulse 80 03/18/20 1314  Resp 18 03/18/20 1314  SpO2 100 % 03/18/20 1314  Vitals shown include unvalidated device data.  Last Pain:  Vitals:   03/18/20 0801  TempSrc:   PainSc: 7       Patients Stated Pain Goal: 6 ([birth year] 0104)  Complications: No complications documented.

## 2020-03-18 NOTE — Anesthesia Preprocedure Evaluation (Addendum)
Anesthesia Evaluation  Patient identified by MRN, date of birth, ID band Patient awake    Reviewed: Allergy & Precautions, NPO status , Patient's Chart, lab work & pertinent test results, reviewed documented beta blocker date and time   Airway Mallampati: III  TM Distance: >3 FB Neck ROM: Full    Dental  (+) Dental Advisory Given   Pulmonary asthma , COPD, Current Smoker and Patient abstained from smoking.,    breath sounds clear to auscultation       Cardiovascular hypertension, Pt. on medications and Pt. on home beta blockers  Rhythm:Regular Rate:Normal     Neuro/Psych negative neurological ROS     GI/Hepatic Neg liver ROS, GERD  ,  Endo/Other  Hypothyroidism   Renal/GU negative Renal ROS     Musculoskeletal  (+) Arthritis ,   Abdominal   Peds  Hematology negative hematology ROS (+)   Anesthesia Other Findings   Reproductive/Obstetrics                            Lab Results  Component Value Date   WBC 7.2 03/10/2020   HGB 14.2 03/10/2020   HCT 44.5 03/10/2020   MCV 94.5 03/10/2020   PLT 215 03/10/2020   Lab Results  Component Value Date   CREATININE 0.56 03/10/2020   BUN 15 03/10/2020   NA 144 03/10/2020   K 4.1 03/10/2020   CL 104 03/10/2020   CO2 27 03/10/2020    Anesthesia Physical Anesthesia Plan  ASA: III  Anesthesia Plan: MAC and Spinal   Post-op Pain Management:    Induction:   PONV Risk Score and Plan: 1 and Propofol infusion, Ondansetron and Treatment may vary due to age or medical condition  Airway Management Planned: Natural Airway and Simple Face Mask  Additional Equipment:   Intra-op Plan:   Post-operative Plan:   Informed Consent: I have reviewed the patients History and Physical, chart, labs and discussed the procedure including the risks, benefits and alternatives for the proposed anesthesia with the patient or authorized representative who has  indicated his/her understanding and acceptance.       Plan Discussed with: CRNA  Anesthesia Plan Comments:         Anesthesia Quick Evaluation

## 2020-03-18 NOTE — Anesthesia Postprocedure Evaluation (Signed)
Anesthesia Post Note  Patient: Wendy Blair  Procedure(s) Performed: TOTAL HIP ARTHROPLASTY ANTERIOR APPROACH (Right Hip)     Patient location during evaluation: PACU Anesthesia Type: Spinal Level of consciousness: awake and alert Pain management: pain level controlled Vital Signs Assessment: post-procedure vital signs reviewed and stable Respiratory status: spontaneous breathing and respiratory function stable Cardiovascular status: blood pressure returned to baseline and stable Postop Assessment: spinal receding Anesthetic complications: no   No complications documented.  Last Vitals:  Vitals:   03/18/20 1515 03/18/20 1530  BP: 140/85 (!) 141/82  Pulse: 79 75  Resp: 17 18  Temp:  36.8 C  SpO2: 100% 100%    Last Pain:  Vitals:   03/18/20 1515  TempSrc:   PainSc: 7                  Tiajuana Amass

## 2020-03-18 NOTE — Op Note (Signed)
03/18/2020  12:11 PM  PATIENT:  Wendy Blair   MRN: [id number]  PRE-OPERATIVE DIAGNOSIS:  OA RIGHT HIP  POST-OPERATIVE DIAGNOSIS:  OA RIGHT HIP  PROCEDURE:  Procedure(s): TOTAL HIP ARTHROPLASTY ANTERIOR APPROACH  PREOPERATIVE INDICATIONS:    Wendy Blair is an 69 y.o. female who has a diagnosis of <principal problem not specified> and elected for surgical management after failing conservative treatment.  The risks benefits and alternatives were discussed with the patient including but not limited to the risks of nonoperative treatment, versus surgical intervention including infection, bleeding, nerve injury, periprosthetic fracture, the need for revision surgery, dislocation, leg length discrepancy, blood clots, cardiopulmonary complications, morbidity, mortality, among others, and they were willing to proceed.     OPERATIVE REPORT     SURGEON:   Renette Butters, MD    ASSISTANT:  Margy Clarks, PA-C, he was present and scrubbed throughout the case, critical for completion in a timely fashion, and for retraction, instrumentation, and closure.     ANESTHESIA:  General    COMPLICATIONS:  None.     COMPONENTS:  Stryker acolade fit femur size 4 with a 36 mm -5 head ball and an acetabular shell size 48 with a  polyethylene liner    PROCEDURE IN DETAIL:   The patient was met in the holding area and  identified.  The appropriate hip was identified and marked at the operative site.  The patient was then transported to the OR  and  placed under anesthesia per that record.  At that point, the patient was  placed in the supine position and  secured to the operating room table and all bony prominences padded. He received pre-operative antibiotics    The operative lower extremity was prepped from the iliac crest to the distal leg.  Sterile draping was performed.  Time out was performed prior to incision.      Skin incision was made just 2 cm lateral to the ASIS  extending in line  with the tensor fascia lata. Electrocautery was used to control all bleeders. I dissected down sharply to the fascia of the tensor fascia lata was confirmed that the muscle fibers beneath were running posteriorly. I then incised the fascia over the superficial tensor fascia lata in line with the incision. The fascia was elevated off the anterior aspect of the muscle the muscle was retracted posteriorly and protected throughout the case. I then used electrocautery to incise the tensor fascia lata fascia control and all bleeders. Immediately visible was the fat over top of the anterior neck and capsule.  I removed the anterior fat from the capsule and elevated the rectus muscle off of the anterior capsule. I then removed a large time of capsule. The retractors were then placed over the anterior acetabulum as well as around the superior and inferior neck.  I then made a femoral neck cut. Then used the power corkscrew to remove the femoral head from the acetabulum and thoroughly irrigated the acetabulum. I sized the femoral head.    I then exposed the deep acetabulum, cleared out any tissue including the ligamentum teres.   After adequate visualization, I excised the labrum, and then sequentially reamed.  I then impacted the acetabular implant into place using fluoroscopy for guidance.  Appropriate version and inclination was confirmed clinically matching their bony anatomy, and with fluoroscopy.  I placed a 20 mm screw in the posterior/superio position with an excellent bite.    I then placed the polyethylene liner in  place  I then adducted the leg and released the external rotators from the posterior femur allowing it to be easily delivered up lateral and anterior to the acetabulum for preparation of the femoral canal.    I then prepared the proximal femur using the cookie-cutter and then sequentially reamed and broached.  A trial broach, neck, and head was utilized, and I reduced the hip and used  floroscopy to assess the neck length and femoral implant.  I then impacted the femoral prosthesis into place into the appropriate version. The hip was then reduced and fluoroscopy confirmed appropriate position. Leg lengths were restored.  I then irrigated the hip copiously again with, and repaired the fascia with Vicryl, followed by monocryl for the subcutaneous tissue, Monocryl for the skin, Steri-Strips and sterile gauze. The patient was then awakened and returned to PACU in stable and satisfactory condition. There were no complications.  POST OPERATIVE PLAN: WBAT, DVT px: SCD's/TED, ambulation and chemical dvt px  Edmonia Lynch, MD Orthopedic Surgeon [phone]

## 2020-03-18 NOTE — Anesthesia Procedure Notes (Signed)
Procedure Name: MAC Date/Time: 03/18/2020 10:25 AM Performed by: Eben Burow, CRNA Pre-anesthesia Checklist: Patient identified, Emergency Drugs available, Suction available, Patient being monitored and Timeout performed Oxygen Delivery Method: Simple face mask Placement Confirmation: positive ETCO2

## 2020-03-18 NOTE — Interval H&P Note (Signed)
History and Physical Interval Note:  03/18/2020 8:59 AM  Wendy Blair  has presented today for surgery, with the diagnosis of OA RIGHT HIP.  The various methods of treatment have been discussed with the patient and family. After consideration of risks, benefits and other options for treatment, the patient has consented to  Procedure(s): TOTAL HIP ARTHROPLASTY ANTERIOR APPROACH (Right) as a surgical intervention.  The patient's history has been reviewed, patient examined, no change in status, stable for surgery.  I have reviewed the patient's chart and labs.  Questions were answered to the patient's satisfaction.     Renette Butters

## 2020-03-18 NOTE — Interval H&P Note (Signed)
History and Physical Interval Note:  03/18/2020 7:41 AM  Wendy Blair  has presented today for surgery, with the diagnosis of OA RIGHT HIP.  The various methods of treatment have been discussed with the patient and family. After consideration of risks, benefits and other options for treatment, the patient has consented to  Procedure(s): TOTAL HIP ARTHROPLASTY ANTERIOR APPROACH (Right) as a surgical intervention.  The patient's history has been reviewed, patient examined, no change in status, stable for surgery.  I have reviewed the patient's chart and labs.  Questions were answered to the patient's satisfaction.     Renette Butters

## 2020-03-18 NOTE — Discharge Instructions (Signed)
You may bear weight as tolerated. Keep your dressing on and dry until follow up. Take medicine to prevent blood clots as directed. Take pain medicine as needed with the goal of transitioning to over the counter medicines.  If needed, you may increase breakthrough pain medication (oxycodone) for the first few days post op - up to 2 tablets every 4 hours.  Stop this medication as soon as you are able.  INSTRUCTIONS AFTER JOINT REPLACEMENT   o Remove items at home which could result in a fall. This includes throw rugs or furniture in walking pathways o ICE to the affected joint every three hours while awake for 30 minutes at a time, for at least the first 3-5 days, and then as needed for pain and swelling.  Continue to use ice for pain and swelling. You may notice swelling that will progress down to the foot and ankle.  This is normal after surgery.  Elevate your leg when you are not up walking on it.   o Continue to use the breathing machine you got in the hospital (incentive spirometer) which will help keep your temperature down.  It is common for your temperature to cycle up and down following surgery, especially at night when you are not up moving around and exerting yourself.  The breathing machine keeps your lungs expanded and your temperature down.   DIET:  As you were doing prior to hospitalization, we recommend a well-balanced diet.  DRESSING / WOUND CARE / SHOWERING  You may shower 3 days after surgery, but keep the wounds dry during showering.  You may use an occlusive plastic wrap (Press'n Seal for example) with blue painter's tape at edges, NO SOAKING/SUBMERGING IN THE BATHTUB.  If the bandage gets wet, change with a clean dry gauze.  If the incision gets wet, pat the wound dry with a clean towel.  ACTIVITY  o Increase activity slowly as tolerated, but follow the weight bearing instructions below.   o No driving for 6 weeks or until further direction given by your physician.  You  cannot drive while taking narcotics.  o No lifting or carrying greater than 10 lbs. until further directed by your surgeon. o Avoid periods of inactivity such as sitting longer than an hour when not asleep. This helps prevent blood clots.  o You may return to work once you are authorized by your doctor.     WEIGHT BEARING   Weight bearing as tolerated with assist device (walker, cane, etc) as directed, use it as long as suggested by your surgeon or therapist, typically at least 4-6 weeks.   EXERCISES  Results after joint replacement surgery are often greatly improved when you follow the exercise, range of motion and muscle strengthening exercises prescribed by your doctor. Safety measures are also important to protect the joint from further injury. Any time any of these exercises cause you to have increased pain or swelling, decrease what you are doing until you are comfortable again and then slowly increase them. If you have problems or questions, call your caregiver or physical therapist for advice.   Rehabilitation is important following a joint replacement. After just a few days of immobilization, the muscles of the leg can become weakened and shrink (atrophy).  These exercises are designed to build up the tone and strength of the thigh and leg muscles and to improve motion. Often times heat used for twenty to thirty minutes before working out will loosen up your tissues and help with   improving the range of motion but do not use heat for the first two weeks following surgery (sometimes heat can increase post-operative swelling).   These exercises can be done on a training (exercise) mat, on the floor, on a table or on a bed. Use whatever works the best and is most comfortable for you.    Use music or television while you are exercising so that the exercises are a pleasant break in your day. This will make your life better with the exercises acting as a break in your routine that you can look  forward to.   Perform all exercises about fifteen times, three times per day or as directed.  You should exercise both the operative leg and the other leg as well.  Exercises include:   . Quad Sets - Tighten up the muscle on the front of the thigh (Quad) and hold for 5-10 seconds.   . Straight Leg Raises - With your knee straight (if you were given a brace, keep it on), lift the leg to 60 degrees, hold for 3 seconds, and slowly lower the leg.  Perform this exercise against resistance later as your leg gets stronger.  . Leg Slides: Lying on your back, slowly slide your foot toward your buttocks, bending your knee up off the floor (only go as far as is comfortable). Then slowly slide your foot back down until your leg is flat on the floor again.  . Angel Wings: Lying on your back spread your legs to the side as far apart as you can without causing discomfort.  . Hamstring Strength:  Lying on your back, push your heel against the floor with your leg straight by tightening up the muscles of your buttocks.  Repeat, but this time bend your knee to a comfortable angle, and push your heel against the floor.  You may put a pillow under the heel to make it more comfortable if necessary.   A rehabilitation program following joint replacement surgery can speed recovery and prevent re-injury in the future due to weakened muscles. Contact your doctor or a physical therapist for more information on knee rehabilitation.    CONSTIPATION  Constipation is defined medically as fewer than three stools per week and severe constipation as less than one stool per week.  Even if you have a regular bowel pattern at home, your normal regimen is likely to be disrupted due to multiple reasons following surgery.  Combination of anesthesia, postoperative narcotics, change in appetite and fluid intake all can affect your bowels.   YOU MUST use at least one of the following options; they are listed in order of increasing strength  to get the job done.  They are all available over the counter, and you may need to use some, POSSIBLY even all of these options:    Drink plenty of fluids (prune juice may be helpful) and high fiber foods Colace 100 mg by mouth twice a day  Senokot for constipation as directed and as needed Dulcolax (bisacodyl), take with full glass of water  Miralax (polyethylene glycol) once or twice a day as needed.  If you have tried all these things and are unable to have a bowel movement in the first 3-4 days after surgery call either your surgeon or your primary doctor.    If you experience loose stools or diarrhea, hold the medications until you stool forms back up.  If your symptoms do not get better within 1 week or if they get worse,   check with your doctor.  If you experience "the worst abdominal pain ever" or develop nausea or vomiting, please contact the office immediately for further recommendations for treatment.   ITCHING:  If you experience itching with your medications, try taking only a single pain pill, or even half a pain pill at a time.  You can also use Benadryl over the counter for itching or also to help with sleep.   TED HOSE STOCKINGS:  Use stockings on both legs until for at least 2 weeks or as directed by physician office. They may be removed at night for sleeping.  MEDICATIONS:  See your medication summary on the "After Visit Summary" that nursing will review with you.  You may have some home medications which will be placed on hold until you complete the course of blood thinner medication.  It is important for you to complete the blood thinner medication as prescribed.  PRECAUTIONS:  If you experience chest pain or shortness of breath - call 911 immediately for transfer to the hospital emergency department.   If you develop a fever greater that 101 F, purulent drainage from wound, increased redness or drainage from wound, foul odor from the wound/dressing, or calf pain - CONTACT  YOUR SURGEON.                                                   FOLLOW-UP APPOINTMENTS:  If you do not already have a post-op appointment, please call the office for an appointment to be seen by your surgeon.  Guidelines for how soon to be seen are listed in your "After Visit Summary", but are typically between 1-4 weeks after surgery.  OTHER INSTRUCTIONS:  Dental Antibiotics:  In most cases prophylactic antibiotics for Dental procdeures after total joint surgery are not necessary.  Exceptions are as follows:  1. History of prior total joint infection  2. Severely immunocompromised (Organ Transplant, cancer chemotherapy, Rheumatoid biologic meds such as Humera)  3. Poorly controlled diabetes (A1C &gt; 8.0, blood glucose over 200)  If you have one of these conditions, contact your surgeon for an antibiotic prescription, prior to your dental procedure.   MAKE SURE YOU:  . Understand these instructions.  . Get help right away if you are not doing well or get worse.    Thank you for letting us be a part of your medical care team.  It is a privilege we respect greatly.  We hope these instructions will help you stay on track for a fast and full recovery!     

## 2020-03-18 NOTE — Anesthesia Procedure Notes (Addendum)
Spinal  Patient location during procedure: OR Start time: 03/18/2020 10:30 AM Staffing Performed: resident/CRNA  Anesthesiologist: Suzette Battiest, MD Resident/CRNA: Eben Burow, CRNA Preanesthetic Checklist Completed: patient identified, IV checked, site marked, risks and benefits discussed, surgical consent, monitors and equipment checked, pre-op evaluation and timeout performed Spinal Block Patient position: sitting Prep: DuraPrep and site prepped and draped Patient monitoring: continuous pulse ox, blood pressure, heart rate and cardiac monitor Approach: midline Location: L3-4 Injection technique: single-shot Needle Needle type: Pencan  Needle gauge: 24 G Needle length: 9 cm Assessment Sensory level: T4 Additional Notes Pt placed in sitting position, spinal kit expiration date checked and verified, + CSF, - heme, pt tolerated well. Dr Ola Spurr present and supervising throughout.

## 2020-03-25 ENCOUNTER — Encounter (HOSPITAL_COMMUNITY): Payer: Self-pay | Admitting: Orthopedic Surgery

## 2020-03-25 MED FILL — SERTRALINE HCL 50 MG TABLET: 50 | 90 days supply | Qty: 90 | Fill #1

## 2020-03-25 MED FILL — METOPROLOL TARTRATE 50 MG T: 50 | 90 days supply | Qty: 180 | Fill #1

## 2020-04-02 ENCOUNTER — Other Ambulatory Visit (HOSPITAL_COMMUNITY): Payer: Self-pay | Admitting: Physician Assistant

## 2020-04-02 MED FILL — HYDROCODON-APAP 5-325: 5-325 | 4 days supply | Qty: 30 | Fill #0

## 2020-04-02 MED FILL — CEPHALEXIN 500 MG CAPSULE: 500 | 14 days supply | Qty: 56 | Fill #0

## 2020-04-08 ENCOUNTER — Other Ambulatory Visit: Payer: Self-pay | Admitting: *Deleted

## 2020-04-08 DIAGNOSIS — Z87891 Personal history of nicotine dependence: Secondary | ICD-10-CM

## 2020-04-08 DIAGNOSIS — F17210 Nicotine dependence, cigarettes, uncomplicated: Secondary | ICD-10-CM

## 2020-04-28 MED FILL — HYDROCHLOROTHIAZIDE 25 MG T: 25 | 90 days supply | Qty: 90 | Fill #1

## 2020-05-05 ENCOUNTER — Encounter: Payer: Self-pay | Admitting: Acute Care

## 2020-05-05 ENCOUNTER — Ambulatory Visit (INDEPENDENT_AMBULATORY_CARE_PROVIDER_SITE_OTHER): Payer: Medicare Other | Admitting: Acute Care

## 2020-05-05 ENCOUNTER — Other Ambulatory Visit: Payer: Self-pay

## 2020-05-05 ENCOUNTER — Ambulatory Visit
Admission: RE | Admit: 2020-05-05 | Discharge: 2020-05-05 | Disposition: A | Discharge status: Home / Self Care | Payer: Medicare Other | Source: Ambulatory Visit | Attending: Acute Care | Admitting: Acute Care

## 2020-05-05 VITALS — BP 126/62 | HR 89 | Temp 98.0°F | Ht 61.0 in | Wt 210.0 lb

## 2020-05-05 DIAGNOSIS — F17210 Nicotine dependence, cigarettes, uncomplicated: Secondary | ICD-10-CM

## 2020-05-05 DIAGNOSIS — Z122 Encounter for screening for malignant neoplasm of respiratory organs: Secondary | ICD-10-CM

## 2020-05-05 DIAGNOSIS — Z87891 Personal history of nicotine dependence: Secondary | ICD-10-CM

## 2020-05-05 NOTE — Progress Notes (Signed)
Shared Decision Making Visit Lung Cancer Screening Program [phone])   Eligibility:  Age 69 y.o.  Pack Years Smoking History Calculation 32 pack year smoking history (# packs/per year x # years smoked)  Recent History of coughing up blood  no  Unexplained weight loss? no ( >Than 15 pounds within the last 6 months )  Prior History Lung / other cancer no (Diagnosis within the last 5 years already requiring surveillance chest CT Scans).  Smoking Status Current Smoker  Former Smokers: Years since quit: NA  Quit Date: NA  Visit Components:  Discussion included one or more decision making aids. yes  Discussion included risk/benefits of screening. yes  Discussion included potential follow up diagnostic testing for abnormal scans. yes  Discussion included meaning and risk of over diagnosis. yes  Discussion included meaning and risk of False Positives. yes  Discussion included meaning of total radiation exposure. yes  Counseling Included:  Importance of adherence to annual lung cancer LDCT screening. yes  Impact of comorbidities on ability to participate in the program. yes  Ability and willingness to under diagnostic treatment. yes  Smoking Cessation Counseling:  Current Smokers:   Discussed importance of smoking cessation. yes  Information about tobacco cessation classes and interventions provided to patient. yes  Patient provided with "ticket" for LDCT Scan. yes  Symptomatic Patient. no  Counseling NA  Diagnosis Code: Tobacco Use Z72.0  Asymptomatic Patient yes  Counseling (Intermediate counseling: > three minutes counseling) O1751  Former Smokers:   Discussed the importance of maintaining cigarette abstinence. yes  Diagnosis Code: Personal History of Nicotine Dependence. W25.852  Information about tobacco cessation classes and interventions provided to patient. Yes  Patient provided with "ticket" for LDCT Scan. yes  Written Order for Lung Cancer  Screening with LDCT placed in Epic. Yes (CT Chest Lung Cancer Screening Low Dose W/O CM) [id number] Z12.2-Screening of respiratory organs Z87.891-Personal history of nicotine dependence  BP 126/62 (BP Location: Left Arm, Cuff Size: Normal)   Pulse 89   Temp 98 F (36.7 C) (Oral)   Ht 5\' 1"  (1.549 m)   Wt 210 lb (95.3 kg)   SpO2 96%   BMI 39.68 kg/m    I have spent 25 minutes of face to face time with Ms. Jessee discussing the risks and benefits of lung cancer screening. We viewed a power point together that explained in detail the above noted topics. We paused at intervals to allow for questions to be asked and answered to ensure understanding.We discussed that the single most powerful action that she can take to decrease her risk of developing lung cancer is to quit smoking. We discussed whether or not she is ready to commit to setting a quit date. We discussed options for tools to aid in quitting smoking including nicotine replacement therapy, non-nicotine medications, support groups, Quit Smart classes, and behavior modification. We discussed that often times setting smaller, more achievable goals, such as eliminating 1 cigarette a day for a week and then 2 cigarettes a day for a week can be helpful in slowly decreasing the number of cigarettes smoked. This allows for a sense of accomplishment as well as providing a clinical benefit. I gave her the " Be Stronger Than Your Excuses" card with contact information for community resources, classes, free nicotine replacement therapy, and access to mobile apps, text messaging, and on-line smoking cessation help. I have also given her my card and contact information in the event she needs to contact me. We discussed the time  and location of the scan, and that either Doroteo Glassman RN or I will call with the results within 24-48 hours of receiving them. I have offered her  a copy of the power point we viewed  as a resource in the event they need reinforcement  of the concepts we discussed today in the office. The patient verbalized understanding of all of  the above and had no further questions upon leaving the office. They have my contact information in the event they have any further questions.  I spent 3 minutes counseling on smoking cessation and the health risks of continued tobacco abuse.  I explained to the patient that there has been a high incidence of coronary artery disease noted on these exams. I explained that this is a non-gated exam therefore degree or severity cannot be determined. This patient is currently on statin therapy. I have asked the patient to follow-up with their PCP regarding any incidental finding of coronary artery disease and management with diet or medication as their PCP  feels is clinically indicated. The patient verbalized understanding of the above and had no further questions upon completion of the visit.      Magdalen Spatz, NP 05/05/2020 3:41 PM

## 2020-05-05 NOTE — Patient Instructions (Signed)
Thank you for participating in the [facility]. It was our pleasure to meet you today. We will call you with the results of your scan within the next few days. Your scan will be assigned a Lung RADS category score by the physicians reading the scans.  This Lung RADS score determines follow up scanning.  See below for description of categories, and follow up screening recommendations. We will be in touch to schedule your follow up screening annually or based on recommendations of our providers. We will fax a copy of your scan results to your Primary Care Physician, or the physician who referred you to the program, to ensure they have the results. Please call the office if you have any questions or concerns regarding your scanning experience or results.  Our office number is [phone]. Please speak with Denise Phelps, RN. She is our Lung Cancer Screening RN. If she is unavailable when you call, please have the office staff send her a message. She will return your call at her earliest convenience. Remember, if your scan is normal, we will scan you annually as long as you continue to meet the criteria for the program. (Age 55-77, Current smoker or smoker who has quit within the last 15 years). If you are a smoker, remember, quitting is the single most powerful action that you can take to decrease your risk of lung cancer and other pulmonary, breathing related problems. We know quitting is hard, and we are here to help.  Please let us know if there is anything we can do to help you meet your goal of quitting. If you are a former smoker, congratulations. We are proud of you! Remain smoke free! Remember you can refer friends or family members through the number above.  We will screen them to make sure they meet criteria for the program. Thank you for helping us take better care of you by participating in Lung Screening.  Lung RADS Categories:  Lung RADS 1: no nodules  or definitely non-concerning nodules.  Recommendation is for a repeat annual scan in 12 months.  Lung RADS 2:  nodules that are non-concerning in appearance and behavior with a very low likelihood of becoming an active cancer. Recommendation is for a repeat annual scan in 12 months.  Lung RADS 3: nodules that are probably non-concerning , includes nodules with a low likelihood of becoming an active cancer.  Recommendation is for a 6-month repeat screening scan. Often noted after an upper respiratory illness. We will be in touch to make sure you have no questions, and to schedule your 6-month scan.  Lung RADS 4 A: nodules with concerning findings, recommendation is most often for a follow up scan in 3 months or additional testing based on our provider's assessment of the scan. We will be in touch to make sure you have no questions and to schedule the recommended 3 month follow up scan.  Lung RADS 4 B:  indicates findings that are concerning. We will be in touch with you to schedule additional diagnostic testing based on our provider's  assessment of the scan.   

## 2020-05-19 NOTE — Progress Notes (Signed)
Please call patient and let them  know their  low dose Ct was read as a Lung RADS 2: nodules that are benign in appearance and behavior with a very low likelihood of becoming a clinically active cancer due to size or lack of growth. Recommendation per radiology is for a repeat LDCT in 12 months. .Please let them  know we will order and schedule their  annual screening scan for 04/2021. Please let them  know there was notation of CAD on their  scan.  Please remind the patient  that this is a non-gated exam therefore degree or severity of disease  cannot be determined. Please have them  follow up with their PCP regarding potential risk factor modification, dietary therapy or pharmacologic therapy if clinically indicated. Pt.  is  currently on statin therapy. Please place order for annual  screening scan for  04/2021 and fax results to PCP. Thanks so much. 

## 2020-05-22 ENCOUNTER — Other Ambulatory Visit: Payer: Self-pay | Admitting: *Deleted

## 2020-05-22 DIAGNOSIS — Z87891 Personal history of nicotine dependence: Secondary | ICD-10-CM

## 2020-05-22 DIAGNOSIS — F17210 Nicotine dependence, cigarettes, uncomplicated: Secondary | ICD-10-CM

## 2020-06-05 MED FILL — LEVOTHYROXINE 50 MCG TABLET: 50 | 90 days supply | Qty: 90 | Fill #2

## 2020-06-05 MED FILL — AMLODIPINE BESYLATE 10 MG T: 10 | 90 days supply | Qty: 90 | Fill #1

## 2020-06-11 NOTE — H&P (Signed)
Patient ID: Wendy Blair MRN: [id number] DOB/AGE: [birth year] 69 y.o.  Chief Complaint: Left Knee Pain.  Planned Procedure Date: 07/01/20          Medical Clearance by Dr. Stephanie Acre Cardiac Clearance by Dr. Virgina Jock Additional clearance by Dr. Erin Fulling (Pulmonary)  HPI: Wendy Blair is a 70 y.o. female who presents for evaluation of OA LEFT KNEE. The patient has a history of pain and functional disability in the right hip due to arthritis and has failed non-surgical conservative treatments for greater than 12 weeks to include NSAID's and/or analgesics, corticosteriod injections, flexibility and strengthening excercises, use of assistive devices and activity modification.  Onset of symptoms was gradual, starting 5 years ago with gradually worsening course since that time. The patient noted no past surgery on the left knee.  Patient currently rates pain at 5 out of 10 with activity. Patient has night pain, worsening of pain with activity and weight bearing, pain that interferes with activities of daily living and pain with passive range of motion.  Patient has evidence of periarticular osteophytes and joint space narrowing by imaging studies.  There is no active infection.      Past Medical History:  Diagnosis Date  . Anxiety and depression   . GERD (gastroesophageal reflux disease)   . Hyperlipidemia   . Hypertension   . Hypothyroidism         Past Surgical History:  Procedure Laterality Date  . TONSILLECTOMY          Allergies  Allergen Reactions  . Penicillins     Rash and itching          Prior to Admission medications   Medication Sig Start Date End Date Taking? Authorizing Provider  amLODipine (NORVASC) 10 MG tablet Take 10 mg by mouth daily. 11/29/19   [provider]  calcium carbonate (OS-CAL) 600 MG TABS Take 600 mg by mouth daily with breakfast.     [provider]  Cholecalciferol (VITAMIN D-3) 1000 UNITS CAPS Take 1 capsule  by mouth 2 (two) times daily.    [provider]  hydrochlorothiazide (HYDRODIURIL) 25 MG tablet Take 25 mg by mouth every morning. 12/28/19   [provider]  levothyroxine (SYNTHROID) 50 MCG tablet Take 50 mcg by mouth daily before breakfast.    [provider]  loratadine (CLARITIN) 10 MG tablet Take 10 mg by mouth daily.    [provider]  metoprolol tartrate (LOPRESSOR) 50 MG tablet Take 50 mg by mouth 2 (two) times daily. 12/28/19   [provider]  Multiple Vitamins-Minerals (MULTIVITAMIN PO) Take 1 tablet by mouth daily.    [provider]  naproxen sodium (ALEVE) 220 MG tablet Take 220 mg by mouth.    [provider]  rosuvastatin (CRESTOR) 20 MG tablet Take 1 tablet (20 mg total) by mouth daily. 01/30/20 04/29/20  Patwardhan, Reynold Bowen, MD  sertraline (ZOLOFT) 50 MG tablet Take 50 mg by mouth daily.    [provider]   Social History        Socioeconomic History  . Marital status: Legally Separated    Spouse name: Not on file  . Number of children: 2  . Years of education: Not on file  . Highest education level: Not on file  Occupational History  . Not on file  Tobacco Use  . Smoking status: Current Some Day Smoker    Packs/day: 0.25    Years: 35.00    Pack years: 8.75  Types: Cigarettes  . Smokeless tobacco: Never Used  . Tobacco comment: quite 2007 and started again 2014-smokes 3-6 per day  Vaping Use  . Vaping Use: Never used  Substance and Sexual Activity  . Alcohol use: Yes    Comment: occ  . Drug use: No  . Sexual activity: Not on file  Other Topics Concern  . Not on file  Social History Narrative  . Not on file   Social Determinants of Health      Financial Resource Strain:   . Difficulty of Paying Living Expenses: Not on file  Food Insecurity:   . Worried About Charity fundraiser in the Last Year: Not on file  . Ran Out of Food in the Last  Year: Not on file  Transportation Needs:   . Lack of Transportation (Medical): Not on file  . Lack of Transportation (Non-Medical): Not on file  Physical Activity:   . Days of Exercise per Week: Not on file  . Minutes of Exercise per Session: Not on file  Stress:   . Feeling of Stress : Not on file  Social Connections:   . Frequency of Communication with Friends and Family: Not on file  . Frequency of Social Gatherings with Friends and Family: Not on file  . Attends Religious Services: Not on file  . Active Member of Clubs or Organizations: Not on file  . Attends Archivist Meetings: Not on file  . Marital Status: Not on file        Family History  Problem Relation Age of Onset  . Prostate cancer Father   . Lymphoma Brother   . Breast cancer Maternal Grandmother   . Stroke Paternal Grandmother   . Prostate cancer Brother     ROS: Currently denies lightheadedness, dizziness, Fever, chills, CP, SOB.              No personal history of DVT, PE, MI, or CVA. No loose teeth or dentures All other systems have been reviewed and were otherwise currently negative with the exception of those mentioned in the HPI and as above.  Objective: Vitals: Ht: 61" Wt: 207.6 lbs Temp: 96.9 BP: 156/81 Pulse: 79 O2 97% on room air.   Physical Exam: General: Alert, NAD. Trendelenberg Gait  HEENT: EOMI, Good Neck Extension, Trachea Midline, Head AT/Cole Pulm: No increased work of breathing.  Clear B/L A/P w/o crackle or wheeze.  CV: RRR, No m/g/r appreciated  GI: soft, NT, ND Neuro: Neuro without gross focal deficit.  Sensation intact distally Skin: No lesions in the area of chief complaint MSK/Surgical Site: Left Knee pain with passive ROM. EHL/FHL.  5/5 strength.  NVI.    Imaging Review Plain radiographs demonstrate severe degenerative joint disease of the left knee.   The bone quality appears to be good for age and reported activity level.  Preoperative templating of the  joint replacement has been completed, documented, and submitted to the Operating Room personnel in order to optimize intra-operative equipment management.  Assessment: OA Left Knee Active Problems:   * No active hospital problems. *   Plan: Plan for Procedure(s): TOTAL LEFT KNEE ARTHROPLASTY   The patient history, physical exam, clinical judgement of the provider and imaging are consistent with end stage degenerative joint disease and total joint arthroplasty is deemed medically necessary. The treatment options including medical management, injection therapy, and arthroplasty were discussed at length. The risks and benefits of Procedure(s): LEFT TOTAL KNEE ARTHROPLASTY Were presented and reviewed.  The risks of nonoperative treatment, versus surgical intervention including but not limited to continued pain, aseptic loosening, stiffness, dislocation/subluxation, infection, bleeding, nerve injury, blood clots, cardiopulmonary complications, morbidity, mortality, among others were discussed. The patient verbalizes understanding and wishes to proceed with the plan.  Patient is being admitted for surgery, pain control, PT, prophylactic antibiotics, VTE prophylaxis, progressive ambulation, ADL's and discharge planning.   Dental prophylaxis discussed and recommended for 2 years postoperatively.   The patient does meet the criteria for TXA which will be used perioperatively.    ASA 81 mg BID will be used postoperatively for DVT prophylaxis in addition to SCDs, and early ambulation.  Plan for Oxycodone, Celebrex, Tylenol for pain.  Robaxin for spasm.  Zofran for nausea.  The patient is planning to be discharged home with OPPT in care of her spouse.

## 2020-06-17 DIAGNOSIS — H2513 Age-related nuclear cataract, bilateral: Secondary | ICD-10-CM | POA: Diagnosis not present

## 2020-06-18 NOTE — Patient Instructions (Signed)
DUE TO COVID-19 ONLY ONE VISITOR IS ALLOWED TO COME WITH YOU AND STAY IN THE WAITING ROOM ONLY DURING PRE OP AND PROCEDURE DAY OF SURGERY. THE 1 VISITOR  MAY VISIT WITH YOU AFTER SURGERY IN YOUR PRIVATE ROOM DURING VISITING HOURS ONLY!  YOU NEED TO HAVE A COVID 19 TEST ON: 06/27/20 @ 11:30 AM , THIS TEST MUST BE DONE BEFORE SURGERY,  COVID TESTING SITE [place] [address], IT IS ON THE RIGHT GOING OUT [address] APPROXIMATELY  2 MINUTES PAST [employer] ON THE RIGHT. ONCE YOUR COVID TEST IS COMPLETED,  PLEASE BEGIN THE QUARANTINE INSTRUCTIONS AS OUTLINED IN YOUR HANDOUT.                Wendy Blair   Your procedure is scheduled on: 07/01/20   Report to [facility] Main  Entrance   Report to short stay at : 5:30 AM     Call this number if you have problems the morning of surgery [phone]    Remember: Do not eat food or drink liquids :After Midnight.   BRUSH YOUR TEETH MORNING OF SURGERY AND RINSE YOUR MOUTH OUT, NO CHEWING GUM CANDY OR MINTS.    Take these medicines the morning of surgery with A SIP OF WATER: amlodipine,cetirizine,synthroid,metropolol,sertraline.                               You may not have any metal on your body including hair pins and              piercings  Do not wear jewelry, make-up, lotions, powders or perfumes, deodorant             Do not wear nail polish on your fingernails.  Do not shave  48 hours prior to surgery.    Do not bring valuables to the hospital. [name].  Contacts, dentures or bridgework may not be worn into surgery.  Leave suitcase in the car. After surgery it may be brought to your room.     Patients discharged the day of surgery will not be allowed to drive home. IF YOU ARE HAVING SURGERY AND GOING HOME THE SAME DAY, YOU MUST HAVE AN ADULT TO DRIVE YOU HOME AND BE WITH YOU FOR 24 HOURS. YOU MAY GO HOME BY TAXI OR UBER OR ORTHERWISE, BUT AN  ADULT MUST ACCOMPANY YOU HOME AND STAY WITH YOU FOR 24 HOURS.  Name and phone number of your driver:  Special Instructions: N/A              Please read over the following fact sheets you were given: ____________________________________________________________________          [facility] - Preparing for Surgery Before surgery, you can play an important role.  Because skin is not sterile, your skin needs to be as free of germs as possible.  You can reduce the number of germs on your skin by washing with CHG (chlorahexidine gluconate) soap before surgery.  CHG is an antiseptic cleaner which kills germs and bonds with the skin to continue killing germs even after washing. Please DO NOT use if you have an allergy to CHG or antibacterial soaps.  If your skin becomes reddened/irritated stop using the CHG and inform your nurse when you arrive at Short Stay.  Do not shave (including legs and underarms) for at least 48 hours prior to the first CHG shower.  You may shave your face/neck. Please follow these instructions carefully:  1.  Shower with CHG Soap the night before surgery and the  morning of Surgery.  2.  If you choose to wash your hair, wash your hair first as usual with your  normal  shampoo.  3.  After you shampoo, rinse your hair and body thoroughly to remove the  shampoo.                           4.  Use CHG as you would any other liquid soap.  You can apply chg directly  to the skin and wash                       Gently with a scrungie or clean washcloth.  5.  Apply the CHG Soap to your body ONLY FROM THE NECK DOWN.   Do not use on face/ open                           Wound or open sores. Avoid contact with eyes, ears mouth and genitals (private parts).                       Wash face,  Genitals (private parts) with your normal soap.             6.  Wash thoroughly, paying special attention to the area where your surgery  will be performed.  7.  Thoroughly rinse your body with warm water  from the neck down.  8.  DO NOT shower/wash with your normal soap after using and rinsing off  the CHG Soap.                9.  Pat yourself dry with a clean towel.            10.  Wear clean pajamas.            11.  Place clean sheets on your bed the night of your first shower and do not  sleep with pets. Day of Surgery : Do not apply any lotions/deodorants the morning of surgery.  Please wear clean clothes to the hospital/surgery center.  FAILURE TO FOLLOW THESE INSTRUCTIONS MAY RESULT IN THE CANCELLATION OF YOUR SURGERY PATIENT SIGNATURE_________________________________  NURSE SIGNATURE__________________________________  ________________________________________________________________________

## 2020-06-18 NOTE — Progress Notes (Signed)
Pt. Needs orders for upcomming surgery. PAT and labs on: 06/19/20.Thanks.

## 2020-06-19 ENCOUNTER — Encounter (HOSPITAL_COMMUNITY)
Admission: RE | Admit: 2020-06-19 | Discharge: 2020-06-19 | Disposition: A | Discharge status: Home / Self Care | Payer: Medicare Other | Source: Ambulatory Visit | Attending: Orthopedic Surgery | Admitting: Orthopedic Surgery

## 2020-06-19 ENCOUNTER — Encounter (HOSPITAL_COMMUNITY): Payer: Self-pay

## 2020-06-19 ENCOUNTER — Other Ambulatory Visit: Payer: Self-pay

## 2020-06-19 DIAGNOSIS — Z01812 Encounter for preprocedural laboratory examination: Secondary | ICD-10-CM | POA: Insufficient documentation

## 2020-06-19 LAB — CBC
HCT: 44.3 % (ref 36.0–46.0)
Hemoglobin: 14.2 g/dL (ref 12.0–15.0)
MCH: 29.7 pg (ref 26.0–34.0)
MCHC: 32.1 g/dL (ref 30.0–36.0)
MCV: 92.7 fL (ref 80.0–100.0)
Platelets: 180 10*3/uL (ref 150–400)
RBC: 4.78 MIL/uL (ref 3.87–5.11)
RDW: 13.5 % (ref 11.5–15.5)
WBC: 6.3 10*3/uL (ref 4.0–10.5)
nRBC: 0 % (ref 0.0–0.2)

## 2020-06-19 LAB — BASIC METABOLIC PANEL
Anion gap: 9 (ref 5–15)
BUN: 19 mg/dL (ref 8–23)
CO2: 29 mmol/L (ref 22–32)
Calcium: 9.6 mg/dL (ref 8.9–10.3)
Chloride: 102 mmol/L (ref 98–111)
Creatinine, Ser: 0.66 mg/dL (ref 0.44–1.00)
GFR, Estimated: >60 mL/min (ref >60)
Glucose, Bld: 92 mg/dL (ref 70–99)
Potassium: 4.1 mmol/L (ref 3.5–5.1)
Sodium: 140 mmol/L (ref 135–145)

## 2020-06-19 LAB — SURGICAL PCR SCREEN
MRSA, PCR: NEGATIVE
Staphylococcus aureus: NEGATIVE

## 2020-06-19 NOTE — Progress Notes (Signed)
COVID Vaccine Completed: Yes Date COVID Vaccine completed: 08/14/19 COVID vaccine manufacturer: Pfizer    PCP - Dr. Jonathon Jordan Cardiologist -   Chest x-ray - CT chest: 05/05/20 EKG - 01/30/20 Stress Test -  ECHO - 02/07/20 Cardiac Cath -  Pacemaker/ICD device last checked:  Sleep Study -  CPAP -   Fasting Blood Sugar -  Checks Blood Sugar _____ times a day  Blood Thinner Instructions: Aspirin Instructions: Last Dose:  Anesthesia review: Hx: HTN,COPD  Patient denies shortness of breath, fever, cough and chest pain at PAT appointment   Patient verbalized understanding of instructions that were given to them at the PAT appointment. Patient was also instructed that they will need to review over the PAT instructions again at home before surgery.

## 2020-06-23 DIAGNOSIS — H25812 Combined forms of age-related cataract, left eye: Secondary | ICD-10-CM | POA: Diagnosis not present

## 2020-06-23 DIAGNOSIS — H25811 Combined forms of age-related cataract, right eye: Secondary | ICD-10-CM | POA: Diagnosis not present

## 2020-06-23 MED FILL — ROSUVASTATIN CALCIUM 20 MG: 20 | 30 days supply | Qty: 30 | Fill #1

## 2020-06-24 MED FILL — METOPROLOL TARTRATE 50 MG T: 50 | 90 days supply | Qty: 180 | Fill #2

## 2020-06-24 MED FILL — SERTRALINE HCL 50 MG TABS: 50 | 90 days supply | Qty: 90 | Fill #2

## 2020-06-24 MED FILL — HYDROCHLOROTHIAZIDE 25 MG T: 25 | 90 days supply | Qty: 90 | Fill #2

## 2020-06-25 ENCOUNTER — Other Ambulatory Visit (HOSPITAL_COMMUNITY): Payer: Self-pay | Admitting: Physician Assistant

## 2020-06-25 MED FILL — HYDROCODON-APAP 5-325: 5-325 | 2 days supply | Qty: 10 | Fill #0

## 2020-06-27 ENCOUNTER — Other Ambulatory Visit (HOSPITAL_COMMUNITY)
Admission: RE | Admit: 2020-06-27 | Discharge: 2020-06-27 | Disposition: A | Discharge status: Home / Self Care | Payer: Medicare Other | Source: Ambulatory Visit | Attending: Orthopedic Surgery | Admitting: Orthopedic Surgery

## 2020-06-27 DIAGNOSIS — Z20822 Contact with and (suspected) exposure to covid-19: Secondary | ICD-10-CM | POA: Diagnosis not present

## 2020-06-27 DIAGNOSIS — Z01812 Encounter for preprocedural laboratory examination: Secondary | ICD-10-CM | POA: Diagnosis not present

## 2020-06-27 LAB — SARS CORONAVIRUS 2 (TAT 6-24 HRS): SARS Coronavirus 2: NEGATIVE

## 2020-06-30 NOTE — Anesthesia Preprocedure Evaluation (Signed)
Anesthesia Evaluation  Patient identified by MRN, date of birth, ID band Patient awake    Reviewed: Allergy & Precautions, NPO status , Patient's Chart, lab work & pertinent test results, reviewed documented beta blocker date and time   History of Anesthesia Complications Negative for: history of anesthetic complications  Airway Mallampati: II  TM Distance: >3 FB Neck ROM: Full    Dental  (+) Dental Advisory Given, Chipped   Pulmonary asthma , COPD,  COPD inhaler, Current Smoker and Patient abstained from smoking.,    Pulmonary exam normal        Cardiovascular hypertension, Pt. on home beta blockers and Pt. on medications Normal cardiovascular exam+ Valvular Problems/Murmurs MR    '21 TTE - EF 50-55%. Moderate (Grade II) MR. Mild TR    Neuro/Psych negative neurological ROS  negative psych ROS   GI/Hepatic Neg liver ROS, GERD  Controlled,  Endo/Other  Hypothyroidism  Obesity   Renal/GU negative Renal ROS     Musculoskeletal  (+) Arthritis ,   Abdominal   Peds  Hematology negative hematology ROS (+)   Anesthesia Other Findings Covid test negative   Reproductive/Obstetrics                            Anesthesia Physical Anesthesia Plan  ASA: III  Anesthesia Plan: Spinal   Post-op Pain Management:  Regional for Post-op pain   Induction:   PONV Risk Score and Plan: 2 and Treatment may vary due to age or medical condition and Propofol infusion  Airway Management Planned: Natural Airway and Simple Face Mask  Additional Equipment: None  Intra-op Plan:   Post-operative Plan:   Informed Consent: I have reviewed the patients History and Physical, chart, labs and discussed the procedure including the risks, benefits and alternatives for the proposed anesthesia with the patient or authorized representative who has indicated his/her understanding and acceptance.       Plan  Discussed with: CRNA and Anesthesiologist  Anesthesia Plan Comments: (Labs reviewed, platelets acceptable. Discussed risks and benefits of spinal, including spinal/epidural hematoma, infection, failed block, and PDPH. Patient expressed understanding and wished to proceed. )       Anesthesia Quick Evaluation

## 2020-07-01 ENCOUNTER — Ambulatory Visit (HOSPITAL_COMMUNITY)
Admission: RE | Admit: 2020-07-01 | Discharge: 2020-07-01 | Disposition: A | Discharge status: Home / Self Care | Payer: Medicare Other | Attending: Orthopedic Surgery | Admitting: Orthopedic Surgery

## 2020-07-01 ENCOUNTER — Ambulatory Visit (HOSPITAL_COMMUNITY): Payer: Medicare Other

## 2020-07-01 ENCOUNTER — Encounter (HOSPITAL_COMMUNITY): Payer: Self-pay | Admitting: Orthopedic Surgery

## 2020-07-01 ENCOUNTER — Encounter (HOSPITAL_COMMUNITY)
Admission: RE | Disposition: A | Discharge status: Home / Self Care | Payer: Self-pay | Source: Home / Self Care | Attending: Orthopedic Surgery

## 2020-07-01 ENCOUNTER — Ambulatory Visit (HOSPITAL_COMMUNITY): Payer: Medicare Other | Admitting: Anesthesiology

## 2020-07-01 ENCOUNTER — Other Ambulatory Visit (HOSPITAL_COMMUNITY): Payer: Self-pay | Admitting: Orthopedic Surgery

## 2020-07-01 DIAGNOSIS — F17210 Nicotine dependence, cigarettes, uncomplicated: Secondary | ICD-10-CM | POA: Insufficient documentation

## 2020-07-01 DIAGNOSIS — Z79899 Other long term (current) drug therapy: Secondary | ICD-10-CM | POA: Insufficient documentation

## 2020-07-01 DIAGNOSIS — Z79890 Hormone replacement therapy (postmenopausal): Secondary | ICD-10-CM | POA: Insufficient documentation

## 2020-07-01 DIAGNOSIS — I10 Essential (primary) hypertension: Secondary | ICD-10-CM | POA: Diagnosis not present

## 2020-07-01 DIAGNOSIS — Z96652 Presence of left artificial knee joint: Secondary | ICD-10-CM | POA: Diagnosis not present

## 2020-07-01 DIAGNOSIS — E782 Mixed hyperlipidemia: Secondary | ICD-10-CM | POA: Diagnosis not present

## 2020-07-01 DIAGNOSIS — G8918 Other acute postprocedural pain: Secondary | ICD-10-CM | POA: Diagnosis not present

## 2020-07-01 DIAGNOSIS — M25762 Osteophyte, left knee: Secondary | ICD-10-CM | POA: Diagnosis not present

## 2020-07-01 DIAGNOSIS — Z880 Allergy status to penicillin: Secondary | ICD-10-CM | POA: Diagnosis not present

## 2020-07-01 DIAGNOSIS — Z471 Aftercare following joint replacement surgery: Secondary | ICD-10-CM | POA: Diagnosis not present

## 2020-07-01 DIAGNOSIS — M1712 Unilateral primary osteoarthritis, left knee: Secondary | ICD-10-CM | POA: Insufficient documentation

## 2020-07-01 HISTORY — PX: TOTAL KNEE ARTHROPLASTY: SHX125

## 2020-07-01 SURGERY — ARTHROPLASTY, KNEE, TOTAL
Anesthesia: Spinal | Site: Knee | Laterality: Left

## 2020-07-01 MED ORDER — EPHEDRINE SULFATE 50 MG/ML IJ SOLN
INTRAMUSCULAR | Status: DC | PRN
  Administered 2020-07-01 (×2): 10 mg via INTRAVENOUS

## 2020-07-01 MED ORDER — HYDROCODONE-ACETAMINOPHEN 5-325 MG PO TABS: 1.0000 | ORAL_TABLET | ORAL | Status: DC | PRN

## 2020-07-01 MED ORDER — ACETAMINOPHEN 500 MG PO TABS
1000.0000 mg | ORAL_TABLET | Freq: Four times a day (QID) | ORAL | 0 refills | Status: DC | PRN
Start: 2020-07-01 — End: 2020-07-01

## 2020-07-01 MED ORDER — ONDANSETRON HCL 4 MG/2ML IJ SOLN
INTRAMUSCULAR | Status: DC | PRN
  Administered 2020-07-01: 4 mg via INTRAVENOUS

## 2020-07-01 MED ORDER — ONDANSETRON HCL 4 MG/2ML IJ SOLN: 4.0000 mg | Freq: Four times a day (QID) | INTRAMUSCULAR | Status: DC | PRN

## 2020-07-01 MED ORDER — OXYCODONE HCL 5 MG PO TABS
ORAL_TABLET | ORAL | Status: AC
  Administered 2020-07-01: 5 mg via ORAL
  Filled 2020-07-01: qty 1

## 2020-07-01 MED ORDER — HYDROCODONE-ACETAMINOPHEN 7.5-325 MG PO TABS
1.0000 | ORAL_TABLET | ORAL | Status: DC | PRN
  Administered 2020-07-01: 1 via ORAL

## 2020-07-01 MED ORDER — OXYCODONE HCL 5 MG/5ML PO SOLN: 5.0000 mg | Freq: Once | ORAL | Status: AC | PRN

## 2020-07-01 MED ORDER — OXYCODONE HCL 5 MG PO TABS: 5.0000 mg | ORAL_TABLET | Freq: Once | ORAL | Status: AC | PRN

## 2020-07-01 MED ORDER — ORAL CARE MOUTH RINSE: 15.0000 mL | Freq: Once | OROMUCOSAL | Status: AC

## 2020-07-01 MED ORDER — DIPHENHYDRAMINE HCL 50 MG/ML IJ SOLN: 50.0000 mg | Freq: Once | INTRAMUSCULAR | Status: DC

## 2020-07-01 MED ORDER — BUPIVACAINE LIPOSOME 1.3 % IJ SUSP
20.0000 mL | Freq: Once | INTRAMUSCULAR | Status: AC
  Administered 2020-07-01: 20 mL
  Filled 2020-07-01: qty 20

## 2020-07-01 MED ORDER — 0.9 % SODIUM CHLORIDE (POUR BTL) OPTIME
TOPICAL | Status: DC | PRN
  Administered 2020-07-01: 1000 mL

## 2020-07-01 MED ORDER — CELECOXIB 100 MG PO CAPS: 100.0000 mg | ORAL_CAPSULE | Freq: Two times a day (BID) | ORAL | 0 refills | Status: DC

## 2020-07-01 MED ORDER — FENTANYL CITRATE (PF) 100 MCG/2ML IJ SOLN
INTRAMUSCULAR | Status: AC
  Filled 2020-07-01: qty 2

## 2020-07-01 MED ORDER — ACETAMINOPHEN 10 MG/ML IV SOLN
INTRAVENOUS | Status: AC
  Filled 2020-07-01: qty 100

## 2020-07-01 MED ORDER — METHOCARBAMOL 500 MG IVPB - SIMPLE MED: 500.0000 mg | Freq: Four times a day (QID) | INTRAVENOUS | Status: DC | PRN

## 2020-07-01 MED ORDER — METOCLOPRAMIDE HCL 5 MG/ML IJ SOLN: 5.0000 mg | Freq: Three times a day (TID) | INTRAMUSCULAR | Status: DC | PRN

## 2020-07-01 MED ORDER — PROPOFOL 500 MG/50ML IV EMUL
INTRAVENOUS | Status: AC
  Filled 2020-07-01: qty 50

## 2020-07-01 MED ORDER — OXYCODONE HCL 5 MG PO TABS: 5.0000 mg | ORAL_TABLET | Freq: Three times a day (TID) | ORAL | 0 refills | Status: DC | PRN

## 2020-07-01 MED ORDER — ONDANSETRON HCL 4 MG/2ML IJ SOLN: 4.0000 mg | Freq: Once | INTRAMUSCULAR | Status: DC | PRN

## 2020-07-01 MED ORDER — METHOCARBAMOL 500 MG PO TABS: 500.0000 mg | ORAL_TABLET | Freq: Four times a day (QID) | ORAL | Status: DC | PRN

## 2020-07-01 MED ORDER — LACTATED RINGERS IV SOLN: INTRAVENOUS | Status: DC

## 2020-07-01 MED ORDER — ONDANSETRON HCL 4 MG PO TABS: 4.0000 mg | ORAL_TABLET | Freq: Every day | ORAL | 1 refills | Status: DC | PRN

## 2020-07-01 MED ORDER — FENTANYL CITRATE (PF) 100 MCG/2ML IJ SOLN
INTRAMUSCULAR | Status: DC | PRN
  Administered 2020-07-01 (×2): 50 ug via INTRAVENOUS

## 2020-07-01 MED ORDER — TRANEXAMIC ACID-NACL 1000-0.7 MG/100ML-% IV SOLN: 1000.0000 mg | Freq: Once | INTRAVENOUS | Status: DC

## 2020-07-01 MED ORDER — LACTATED RINGERS IV BOLUS
250.0000 mL | Freq: Once | INTRAVENOUS | Status: AC
  Administered 2020-07-01: 250 mL via INTRAVENOUS

## 2020-07-01 MED ORDER — METOCLOPRAMIDE HCL 5 MG PO TABS
5.0000 mg | ORAL_TABLET | Freq: Three times a day (TID) | ORAL | Status: DC | PRN
  Filled 2020-07-01: qty 2

## 2020-07-01 MED ORDER — CEFAZOLIN SODIUM-DEXTROSE 2-4 GM/100ML-% IV SOLN
INTRAVENOUS | Status: AC
  Filled 2020-07-01: qty 100

## 2020-07-01 MED ORDER — HYDROCODONE-ACETAMINOPHEN 7.5-325 MG PO TABS
ORAL_TABLET | ORAL | Status: AC
  Filled 2020-07-01: qty 1

## 2020-07-01 MED ORDER — METHOCARBAMOL 500 MG PO TABS: 500.0000 mg | ORAL_TABLET | Freq: Three times a day (TID) | ORAL | 0 refills | Status: DC | PRN

## 2020-07-01 MED ORDER — MIDAZOLAM HCL 2 MG/2ML IJ SOLN
INTRAMUSCULAR | Status: AC
  Filled 2020-07-01: qty 2

## 2020-07-01 MED ORDER — BUPIVACAINE IN DEXTROSE 0.75-8.25 % IT SOLN
INTRATHECAL | Status: DC | PRN
  Administered 2020-07-01: 1.8 mL via INTRATHECAL

## 2020-07-01 MED ORDER — TRANEXAMIC ACID-NACL 1000-0.7 MG/100ML-% IV SOLN
1000.0000 mg | INTRAVENOUS | Status: AC
  Administered 2020-07-01: 1000 mg via INTRAVENOUS
  Filled 2020-07-01: qty 100

## 2020-07-01 MED ORDER — ACETAMINOPHEN 500 MG PO TABS: 500.0000 mg | ORAL_TABLET | Freq: Four times a day (QID) | ORAL | Status: DC

## 2020-07-01 MED ORDER — ONDANSETRON HCL 4 MG PO TABS
4.0000 mg | ORAL_TABLET | Freq: Four times a day (QID) | ORAL | Status: DC | PRN
  Filled 2020-07-01: qty 1

## 2020-07-01 MED ORDER — ALBUMIN HUMAN 5 % IV SOLN
INTRAVENOUS | Status: AC
  Filled 2020-07-01: qty 250

## 2020-07-01 MED ORDER — TRAMADOL HCL 50 MG PO TABS
50.0000 mg | ORAL_TABLET | Freq: Four times a day (QID) | ORAL | Status: DC
Start: 2020-07-01 — End: 2020-07-01

## 2020-07-01 MED ORDER — SODIUM CHLORIDE 0.9 % IR SOLN
Status: DC | PRN
  Administered 2020-07-01: 1000 mL

## 2020-07-01 MED ORDER — MIDAZOLAM HCL 5 MG/5ML IJ SOLN
INTRAMUSCULAR | Status: DC | PRN
  Administered 2020-07-01: 2 mg via INTRAVENOUS

## 2020-07-01 MED ORDER — MORPHINE SULFATE (PF) 4 MG/ML IV SOLN: 0.5000 mg | INTRAVENOUS | Status: DC | PRN

## 2020-07-01 MED ORDER — LACTATED RINGERS IV BOLUS
500.0000 mL | Freq: Once | INTRAVENOUS | Status: AC
  Administered 2020-07-01: 500 mL via INTRAVENOUS

## 2020-07-01 MED ORDER — FENTANYL CITRATE (PF) 100 MCG/2ML IJ SOLN
INTRAMUSCULAR | Status: AC
  Administered 2020-07-01: 25 ug via INTRAVENOUS
  Filled 2020-07-01: qty 2

## 2020-07-01 MED ORDER — METHOCARBAMOL 500 MG IVPB - SIMPLE MED
INTRAVENOUS | Status: AC
  Administered 2020-07-01: 500 mg via INTRAVENOUS
  Filled 2020-07-01: qty 50

## 2020-07-01 MED ORDER — PROPOFOL 500 MG/50ML IV EMUL
INTRAVENOUS | Status: DC | PRN
  Administered 2020-07-01: 75 ug/kg/min via INTRAVENOUS

## 2020-07-01 MED ORDER — CEFAZOLIN SODIUM-DEXTROSE 2-4 GM/100ML-% IV SOLN
2.0000 g | Freq: Four times a day (QID) | INTRAVENOUS | Status: DC
  Administered 2020-07-01: 2 g via INTRAVENOUS

## 2020-07-01 MED ORDER — CHLORHEXIDINE GLUCONATE 0.12 % MT SOLN
15.0000 mL | Freq: Once | OROMUCOSAL | Status: AC
  Administered 2020-07-01: 15 mL via OROMUCOSAL

## 2020-07-01 MED ORDER — STERILE WATER FOR IRRIGATION IR SOLN
Status: DC | PRN
  Administered 2020-07-01: 2000 mL

## 2020-07-01 MED ORDER — FENTANYL CITRATE (PF) 100 MCG/2ML IJ SOLN
25.0000 ug | INTRAMUSCULAR | Status: DC | PRN
  Administered 2020-07-01 (×2): 25 ug via INTRAVENOUS

## 2020-07-01 MED ORDER — CEFAZOLIN SODIUM-DEXTROSE 2-4 GM/100ML-% IV SOLN
2.0000 g | INTRAVENOUS | Status: AC
  Administered 2020-07-01: 2 g via INTRAVENOUS
  Filled 2020-07-01: qty 100

## 2020-07-01 MED ORDER — ALBUMIN HUMAN 5 % IV SOLN
12.5000 g | Freq: Once | INTRAVENOUS | Status: AC
  Administered 2020-07-01: 12.5 g via INTRAVENOUS

## 2020-07-01 MED ORDER — ACETAMINOPHEN 325 MG PO TABS
325.0000 mg | ORAL_TABLET | Freq: Four times a day (QID) | ORAL | Status: DC | PRN
Start: 2020-07-02 — End: 2020-07-01

## 2020-07-01 MED ORDER — SODIUM CHLORIDE 0.9% FLUSH
INTRAVENOUS | Status: DC | PRN
  Administered 2020-07-01: 30 mL

## 2020-07-01 MED ORDER — ROPIVACAINE HCL 7.5 MG/ML IJ SOLN
INTRAMUSCULAR | Status: DC | PRN
  Administered 2020-07-01: 20 mL via PERINEURAL

## 2020-07-01 MED ORDER — ASPIRIN EC 81 MG PO TBEC: 81.0000 mg | DELAYED_RELEASE_TABLET | Freq: Two times a day (BID) | ORAL | 0 refills | Status: AC

## 2020-07-01 MED FILL — CELECOXIB 100 MG CAPS: 100 | 30 days supply | Qty: 60 | Fill #0

## 2020-07-01 MED FILL — oxyCODONE HCL 5 MG TABS: 5 | 7 days supply | Qty: 21 | Fill #0

## 2020-07-01 MED FILL — METHOCARBAMOL 500 MG TABS: 500 | 6 days supply | Qty: 20 | Fill #0

## 2020-07-01 MED FILL — ONDANSETRON HCL 4 MG TABS: 4 | 10 days supply | Qty: 10 | Fill #0

## 2020-07-01 SURGICAL SUPPLY — 51 items
BLADE HEX COATED 2.75 (ELECTRODE) ×2 IMPLANT
BLADE SAG 18X100X1.27 (BLADE) ×2 IMPLANT
BLADE SAGITTAL 25.0X1.37X90 (BLADE) ×2 IMPLANT
BLADE SURG 15 STRL LF DISP TIS (BLADE) ×1 IMPLANT
BLADE SURG 15 STRL SS (BLADE) ×2
BLADE SURG SZ10 CARB STEEL (BLADE) ×4 IMPLANT
BNDG CMPR MED 10X6 ELC LF (GAUZE/BANDAGES/DRESSINGS) ×1
BNDG ELASTIC 6X10 VLCR STRL LF (GAUZE/BANDAGES/DRESSINGS) ×2 IMPLANT
BOWL SMART MIX CTS (DISPOSABLE) IMPLANT
BSPLAT TIB 4 KN TRITANIUM (Knees) ×1 IMPLANT
CLSR STERI-STRIP ANTIMIC 1/2X4 (GAUZE/BANDAGES/DRESSINGS) ×3 IMPLANT
COVER SURGICAL LIGHT HANDLE (MISCELLANEOUS) ×2 IMPLANT
COVER WAND RF STERILE (DRAPES) ×1 IMPLANT
CUFF TOURN SGL QUICK 34 (TOURNIQUET CUFF) ×2
CUFF TRNQT CYL 34X4.125X (TOURNIQUET CUFF) ×1 IMPLANT
DECANTER SPIKE VIAL GLASS SM (MISCELLANEOUS) IMPLANT
DRAPE U-SHAPE 47X51 STRL (DRAPES) ×2 IMPLANT
DRSG MEPILEX BORDER 4X12 (GAUZE/BANDAGES/DRESSINGS) ×2 IMPLANT
DURAPREP 26ML APPLICATOR (WOUND CARE) ×4 IMPLANT
FEMORAL RETAINING CRUC KNEE #4 (Knees) ×2 IMPLANT
GLOVE SRG 8 PF TXTR STRL LF DI (GLOVE) ×1 IMPLANT
GLOVE SURG ENC MOIS LTX SZ7.5 (GLOVE) ×4 IMPLANT
GLOVE SURG UNDER POLY LF SZ7.5 (GLOVE) ×2 IMPLANT
GLOVE SURG UNDER POLY LF SZ8 (GLOVE) ×2
GOWN SPEC L4 XLG W/TWL (GOWN DISPOSABLE) ×1 IMPLANT
GOWN STRL REUS W/ TWL LRG LVL3 (GOWN DISPOSABLE) ×2 IMPLANT
GOWN STRL REUS W/TWL LRG LVL3 (GOWN DISPOSABLE) ×4
HANDPIECE INTERPULSE COAX TIP (DISPOSABLE) ×2
HOLDER FOLEY CATH W/STRAP (MISCELLANEOUS) IMPLANT
IMMOBILIZER KNEE 22 UNIV (SOFTGOODS) ×2 IMPLANT
INSERT TRIATH X3 SZ4 9 (Insert) ×2 IMPLANT
KIT TURNOVER KIT A (KITS) ×2 IMPLANT
KNEE PATELLA ASYMMETRIC 9X29 (Knees) ×2 IMPLANT
KNEE TIBIAL COMP TRI SZ4 (Knees) ×2 IMPLANT
MANIFOLD NEPTUNE II (INSTRUMENTS) ×2 IMPLANT
NS IRRIG 1000ML POUR BTL (IV SOLUTION) ×2 IMPLANT
PACK ICE MAXI GEL EZY WRAP (MISCELLANEOUS) ×2 IMPLANT
PACK TOTAL KNEE CUSTOM (KITS) ×2 IMPLANT
PENCIL SMOKE EVACUATOR (MISCELLANEOUS) IMPLANT
PIN FLUTED HEDLESS FIX 3.5X1/8 (PIN) ×1 IMPLANT
PROTECTOR NERVE ULNAR (MISCELLANEOUS) ×2 IMPLANT
SET HNDPC FAN SPRY TIP SCT (DISPOSABLE) ×1 IMPLANT
SUT MNCRL AB 3-0 PS2 18 (SUTURE) ×2 IMPLANT
SUT VIC AB 0 CT1 36 (SUTURE) ×2 IMPLANT
SUT VIC AB 1 CT1 36 (SUTURE) ×4 IMPLANT
SUT VIC AB 2-0 CT1 27 (SUTURE) ×2
SUT VIC AB 2-0 CT1 TAPERPNT 27 (SUTURE) ×1 IMPLANT
TRAY FOLEY MTR SLVR 14FR STAT (SET/KITS/TRAYS/PACK) ×1 IMPLANT
TRAY FOLEY MTR SLVR 16FR STAT (SET/KITS/TRAYS/PACK) IMPLANT
TUBE SUCTION HIGH CAP CLEAR NV (SUCTIONS) ×2 IMPLANT
WRAP KNEE MAXI GEL POST OP (GAUZE/BANDAGES/DRESSINGS) ×1 IMPLANT

## 2020-07-01 NOTE — Progress Notes (Signed)
Pt attempted to get up with physical therapy, BP dropped to71/49, diaphoretic, pale. Pt assisted back to bed, placed in trendelenburg position, IVF resumed. BP remaining in low 80s/50s, Dr. Fransisco Beau notified. Orders received for albumin, will continue to monitor. Coolidge Breeze, RN 07/01/2020

## 2020-07-01 NOTE — Interval H&P Note (Signed)
I participated in the care of this patient and agree with the above history, physical and evaluation. I performed a review of the history and a physical exam as detailed   Oz Gammel Daniel Loisann Roach MD  

## 2020-07-01 NOTE — Anesthesia Procedure Notes (Signed)
Spinal  Patient location during procedure: OR Start time: 07/01/2020 7:20 AM End time: 07/01/2020 7:23 AM Staffing Performed: anesthesiologist  Anesthesiologist: Audry Pili, MD Preanesthetic Checklist Completed: patient identified, IV checked, risks and benefits discussed, surgical consent, monitors and equipment checked, pre-op evaluation and timeout performed Spinal Block Patient position: sitting Prep: DuraPrep Patient monitoring: heart rate, cardiac monitor, continuous pulse ox and blood pressure Approach: midline Location: L2-3 Injection technique: single-shot Needle Needle type: Pencan  Needle gauge: 24 G Additional Notes Consent was obtained prior to the procedure with all questions answered and concerns addressed. Risks including, but not limited to, bleeding, infection, nerve damage, paralysis, failed block, inadequate analgesia, allergic reaction, high spinal, itching, and headache were discussed and the patient wished to proceed. Functioning IV was confirmed and monitors were applied. Sterile prep and drape, including hand hygiene, mask, and sterile gloves were used. The patient was positioned and the spine was prepped. The skin was anesthetized with lidocaine. Free flow of clear CSF was obtained prior to injecting local anesthetic into the CSF. The spinal needle aspirated freely following injection. The needle was carefully withdrawn. The patient tolerated the procedure well.   Renold Don, MD

## 2020-07-01 NOTE — Transfer of Care (Signed)
Immediate Anesthesia Transfer of Care Note  Patient: Wendy Blair  Procedure(s) Performed: TOTAL KNEE ARTHROPLASTY (Left Knee)  Patient Location: PACU  Anesthesia Type:Spinal  Level of Consciousness: sedated, patient cooperative and responds to stimulation  Airway & Oxygen Therapy: Patient Spontanous Breathing and Patient connected to face mask oxygen  Post-op Assessment: Report given to RN and Post -op Vital signs reviewed and stable  Post vital signs: Reviewed and stable  Last Vitals:  Vitals Value Taken Time  BP 88/62 07/01/20 0922  Temp    Pulse 72 07/01/20 0924  Resp 16 07/01/20 0924  SpO2 100 % 07/01/20 0924  Vitals shown include unvalidated device data.  Last Pain:  Vitals:   07/01/20 0605  TempSrc:   PainSc: 5       Patients Stated Pain Goal: 4 (29/02/11 1552)  Complications: No complications documented.

## 2020-07-01 NOTE — Progress Notes (Signed)
Physical Therapy Treatment Patient Details Name: Wendy Blair MRN: [id number] DOB: August 22, 1950 Today's Date: 07/01/2020    History of Present Illness Patient is a 70 y.o. female s/p L TKA on 07/01/2020 [place] significant for HTN, hypothyroidism, HLD, GERD, COPD, OA, R THA (2021), and R shoulder arthroplasty.    PT Comments    Pt seen for treatment in PACU for SDS.  She was limited earlier today due to orthostatic hypotension, but BP was stable this afternoon and pt able to ambulate and perform stairs safely.  Pt has had THA in past and was familiar with several of the exercises for TKA.  She has support and DME at home.  Pt clear to d/c home from PT perspective, but will benefit from further therapy if remains hospitalized to progress independence.     Follow Up Recommendations  Follow surgeon's recommendation for DC plan and follow-up therapies     Equipment Recommendations  None recommended by PT (pt has RW)    Recommendations for Other Services       Precautions / Restrictions Precautions Precautions: Fall Restrictions Other Position/Activity Restrictions: WBAT    Mobility  Bed Mobility Overal bed mobility: Needs Assistance Bed Mobility: Supine to Sit;Sit to Supine     Supine to sit: Supervision Sit to supine: Supervision   General bed mobility comments: Performed safely without assist; BP was stable    Transfers Overall transfer level: Needs assistance Equipment used: Rolling walker (2 wheeled) Transfers: Sit to/from Stand Sit to Stand: Min guard         General transfer comment: Performed from elevated bed and from low toilet.  Able to do safely, cued for L LE managment  Ambulation/Gait Ambulation/Gait assistance: Min guard Gait Distance (Feet): 80 Feet Assistive device: Rolling walker (2 wheeled) Gait Pattern/deviations: Step-to pattern;Decreased stride length;Decreased weight shift to left Gait velocity: decreased   General Gait Details: Step to L  gait; min cues for RW proximity; min guard for safety   Stairs Stairs: Yes Stairs assistance: Min guard Stair Management: Two rails;Step to pattern;Forwards;With walker Number of Stairs: 3 General stair comments: Up/down 2 steps with rails and up/down 1 platform with RW.  Pt able to do correct sequencing without cues.   Wheelchair Mobility    Modified Rankin (Stroke Patients Only)       Balance Overall balance assessment: Needs assistance Sitting-balance support: No upper extremity supported Sitting balance-Leahy Scale: Good     Standing balance support: Bilateral upper extremity supported;No upper extremity supported Standing balance-Leahy Scale: Fair Standing balance comment: Static stand and washing hands at sink without support; RW for ambulation                            Cognition Arousal/Alertness: Awake/alert Behavior During Therapy: WFL for tasks assessed/performed Overall Cognitive Status: Within Functional Limits for tasks assessed                                        Exercises Total Joint Exercises Ankle Circles/Pumps: AROM;Both;5 reps;Seated Quad Sets: AROM;Both;5 reps;Supine Towel Squeeze: AROM;Both;5 reps;Supine Heel Slides: AROM;5 reps;Left;Supine Hip ABduction/ADduction: AROM;Supine;Left;5 reps Long Arc Quad: AROM;Left;5 reps;Seated Knee Flexion: AROM;10 reps;Seated;Left Goniometric ROM: L knee -5 degrees to 80 degrees    General Comments General comments (skin integrity, edema, etc.): BP was stable (low 100's/70's) and  no syncopal symptoms.  Educated on safe  ice use, no pivots, resting with leg straight, and HEP      Pertinent Vitals/Pain Pain Assessment: 0-10 Pain Score: 5  (2/10 rest, 5/10 walk, 8/10 flexion) Pain Location: Lt knee Pain Descriptors / Indicators: Discomfort;Sharp Pain Intervention(s): Limited activity within patient's tolerance;Monitored during session;Premedicated before session    Home  Living                      Prior Function            PT Goals (current goals can now be found in the care plan section) Acute Rehab PT Goals Patient Stated Goal: go home PT Goal Formulation: With patient Time For Goal Achievement: 07/08/20 Potential to Achieve Goals: Good Progress towards PT goals: Progressing toward goals    Frequency    7X/week      PT Plan Current plan remains appropriate    Co-evaluation              AM-PAC PT "6 Clicks" Mobility   Outcome Measure  Help needed turning from your back to your side while in a flat bed without using bedrails?: None Help needed moving from lying on your back to sitting on the side of a flat bed without using bedrails?: A Little Help needed moving to and from a bed to a chair (including a wheelchair)?: A Little Help needed standing up from a chair using your arms (e.g., wheelchair or bedside chair)?: A Little Help needed to walk in hospital room?: A Little Help needed climbing 3-5 steps with a railing? : A Little 6 Click Score: 19    End of Session Equipment Utilized During Treatment: Gait belt Activity Tolerance: Patient tolerated treatment well Patient left: in bed;with call bell/phone within reach Nurse Communication: Mobility status PT Visit Diagnosis: Muscle weakness (generalized) (M62.81);Pain     Time: 9030-0923 PT Time Calculation (min) (ACUTE ONLY): 30 min  Charges:  $Gait Training: 8-22 mins $Therapeutic Exercise: 8-22 mins                     Abran Richard, PT Acute Rehab Services Pager [phone] [facility] 07/01/2020, 3:39 PM

## 2020-07-01 NOTE — Evaluation (Addendum)
Physical Therapy Evaluation Patient Details Name: Wendy Blair MRN: [id number] DOB: 1950/06/27 Today's Date: 07/01/2020   History of Present Illness  patient is a 70 y.o. female s/p L TKA on 07/01/2020 [place] significant for HTN, hypothyroidism, HLD, GERD, COPD, OA, R THA (2021), and R shoulder arthroplasty.  Clinical Impression   Pt is a 69y.o. female s/p Lt TKA POD 0. Pt reports that she modified is independent with use of cane for mobility at baseline. Pt performed supine to sit transfer with supervision for safety Pt became nauseous, pale, and hypotensive with supine to sit transfer with BP decreased from 124/68 to 95/53. After 5 min sitting symptoms did not improve and BP dropped further to 71/49. Pt was assisted to supine and put in trendelenburg position. Pt required MIN assist for progression of LEs onto bed for sit to supine transfer. BP remained in 80's/50's between 6-58min after, RN notified. Pt will benefit from skilled PT to increase independence and safety with mobility. Acute therapy to follow up during stay.      Follow Up Recommendations Outpatient PT;Follow surgeon's recommendation for DC plan and follow-up therapies    Equipment Recommendations  None recommended by PT (pt owns RW)    Recommendations for Other Services       Precautions / Restrictions Precautions Precautions: Fall Restrictions Weight Bearing Restrictions: No Other Position/Activity Restrictions: WBAT      Mobility  Bed Mobility Overal bed mobility: Needs Assistance Bed Mobility: Supine to Sit;Sit to Supine     Supine to sit: Supervision Sit to supine: Min assist   General bed mobility comments: pt was able to progress LEs off of EOB with supervision for safety. Pt required MIN assist for progression of LEs onto bed with sit to supine transfer. Pt with BP decrease from 124/68 in supine to 93/53 in sititng. Pt reported nausea and feeling warm and displayed pallor, denied feeling  lightheaded/dizzy. Pt's BP further decreasing to 71/94 following 82min of sitting EOB. Pt transitioned to trendelenburg with BPs remaining in 80's/50s, RN notified.    Transfers                    Ambulation/Gait                Stairs            Wheelchair Mobility    Modified Rankin (Stroke Patients Only)       Balance Overall balance assessment: Needs assistance Sitting-balance support: Single extremity supported Sitting balance-Leahy Scale: Fair                                       Pertinent Vitals/Pain Pain Assessment: 0-10 Pain Score: 8  Pain Location: Lt knee Pain Descriptors / Indicators: Discomfort;Sharp;Tender Pain Intervention(s): Limited activity within patient's tolerance;Monitored during session;Repositioned;Ice applied    Home Living Family/patient expects to be discharged to:: Private residence Living Arrangements: Spouse/significant other Available Help at Discharge: Neighbor Type of Home: House Home Access: Stairs to enter Entrance Stairs-Rails: None Entrance Stairs-Number of Steps: 1 Home Layout: Able to live on main level with bedroom/bathroom Home Equipment: Walker - 2 wheels;Cane - quad;Bedside commode Additional Comments: pt's partner will be available to assist at home. Neighbor is a Marine scientist may be able to check in intermittently.    Prior Function Level of Independence: Independent with assistive device(s)         Comments: using  cane for ambulation     Hand Dominance   Dominant Hand: Right    Extremity/Trunk Assessment   Upper Extremity Assessment Upper Extremity Assessment: Overall WFL for tasks assessed    Lower Extremity Assessment Lower Extremity Assessment: LLE deficits/detail LLE Deficits / Details: pt with good quad set strength and able to perform full SLR with no extensor lag LLE Sensation: WNL LLE Coordination: WNL    Cervical / Trunk Assessment Cervical / Trunk Assessment:  Normal  Communication   Communication: No difficulties  Cognition Arousal/Alertness: Awake/alert Behavior During Therapy: WFL for tasks assessed/performed Overall Cognitive Status: Within Functional Limits for tasks assessed                                        General Comments      Exercises     Assessment/Plan    PT Assessment Patient needs continued PT services  PT Problem List Decreased strength;Decreased range of motion;Decreased activity tolerance;Decreased balance;Decreased mobility;Pain       PT Treatment Interventions DME instruction;Gait training;Stair training;Functional mobility training;Therapeutic activities;Therapeutic exercise;Balance training;Patient/family education    PT Goals (Current goals can be found in the Care Plan section)  Acute Rehab PT Goals Patient Stated Goal: none stated PT Goal Formulation: With patient Time For Goal Achievement: 07/08/20 Potential to Achieve Goals: Good    Frequency 7X/week   Barriers to discharge        Co-evaluation               AM-PAC PT "6 Clicks" Mobility  Outcome Measure Help needed turning from your back to your side while in a flat bed without using bedrails?: None Help needed moving from lying on your back to sitting on the side of a flat bed without using bedrails?: None Help needed moving to and from a bed to a chair (including a wheelchair)?: A Lot Help needed standing up from a chair using your arms (e.g., wheelchair or bedside chair)?: A Lot Help needed to walk in hospital room?: A Lot Help needed climbing 3-5 steps with a railing? : A Lot 6 Click Score: 16    End of Session   Activity Tolerance: Treatment limited secondary to medical complications (Comment) (symptomatic hypotension) Patient left: in bed;with call bell/phone within reach;with nursing/sitter in room Nurse Communication: Mobility status (symptomatic hypotension) PT Visit Diagnosis: Muscle weakness  (generalized) (M62.81);Pain    Time: 5035-4656 PT Time Calculation (min) (ACUTE ONLY): 22 min   Charges:              Elna Breslow, SPT  Acute rehab    Elna Breslow 07/01/2020, 2:06 PM

## 2020-07-01 NOTE — Anesthesia Procedure Notes (Signed)
Anesthesia Regional Block: Adductor canal block   Pre-Anesthetic Checklist: ,, timeout performed, Correct Patient, Correct Site, Correct Laterality, Correct Procedure, Correct Position, site marked, Risks and benefits discussed,  Surgical consent,  Pre-op evaluation,  At surgeon's request and post-op pain management  Laterality: Left  Prep: chloraprep       Needles:  Injection technique: Single-shot  Needle Type: Echogenic Needle     Needle Length: 10cm  Needle Gauge: 21     Additional Needles:   Narrative:  Start time: 07/01/2020 7:00 AM End time: 07/01/2020 7:04 AM Injection made incrementally with aspirations every 5 mL.  Performed by: Personally  Anesthesiologist: Audry Pili, MD  Additional Notes: No pain on injection. No increased resistance to injection. Injection made in 5cc increments. Good needle visualization. Patient tolerated the procedure well.

## 2020-07-01 NOTE — Op Note (Signed)
DATE OF SURGERY:  07/01/2020 TIME: 8:45 AM  PATIENT NAME:  Wendy Blair   AGE: 70 y.o.    PRE-OPERATIVE DIAGNOSIS:  OA LEFT KNEE  POST-OPERATIVE DIAGNOSIS:  Same  PROCEDURE:  Procedure(s): TOTAL KNEE ARTHROPLASTY   SURGEON:  Renette Butters, MD   ASSISTANT:  Aggie Moats, PA-C, he was present and scrubbed throughout the case, critical for completion in a timely fashion, and for retraction, instrumentation, and closure.    OPERATIVE IMPLANTS: [employer] Triathlon CR. Press fit knee  Femur size 4, Tibia size 4, Patella size 29 3-peg oval button, with a 9 mm polyethylene insert.   PREOPERATIVE INDICATIONS:  Wendy Blair is a 70 y.o. year old female with end stage bone on bone degenerative arthritis of the knee who failed conservative treatment, including injections, antiinflammatories, activity modification, and assistive devices, and had significant impairment of their activities of daily living, and elected for Total Knee Arthroplasty.   The risks, benefits, and alternatives were discussed at length including but not limited to the risks of infection, bleeding, nerve injury, stiffness, blood clots, the need for revision surgery, cardiopulmonary complications, among others, and they were willing to proceed.   OPERATIVE DESCRIPTION:  The patient was brought to the operative room and placed in a supine position.  General anesthesia was administered.  IV antibiotics were given.  The lower extremity was prepped and draped in the usual sterile fashion.  Time out was performed.  The leg was elevated and exsanguinated and the tourniquet was inflated.  Anterior approach was performed.  The patella was everted and osteophytes were removed.  The anterior horn of the medial and lateral meniscus was removed.   The distal femur was opened with the drill and the intramedullary distal femoral cutting jig was utilized, set at 5 degrees resecting 8 mm off the distal femur.  Care was taken to  protect the collateral ligaments.  The distal femoral sizing jig was applied, taking care to avoid notching.  Then the 4-in-1 cutting jig was applied and the anterior and posterior femur was cut, along with the chamfer cuts.  All posterior osteophytes were removed.  The flexion gap was then measured and was symmetric with the extension gap.  Then the extramedullary tibial cutting jig was utilized making the appropriate cut using the anterior tibial crest as a reference building in appropriate posterior slope.  Care was taken during the cut to protect the medial and collateral ligaments.  The proximal tibia was removed along with the posterior horns of the menisci.  The PCL was sacrificed.    The extensor gap was measured and was approximately 29mm.    I completed the distal femoral preparation using the appropriate jig to prepare the box.  The patella was then measured, and cut with the saw.    The proximal tibia sized and prepared accordingly with the reamer and the punch, and then all components were trialed with the above sized poly insert.  The knee was found to have excellent balance and full motion.    The above named components were then impacted into place and Poly tibial piece and patella were inserted.  I was very happy with his stability and ROM  I performed a periarticular injection with marcaine and toradol  The knee was easily taken through a range of motion and the patella tracked well and the knee irrigated copiously and the parapatellar and subcutaneous tissue closed with vicryl, and monocryl with steri strips for the skin.  The  incision was dressed with sterile gauze and the tourniquet released and the patient was awakened and returned to the PACU in stable and satisfactory condition.  There were no complications.  Total tourniquet time was roughly 60 minutes.   POSTOPERATIVE PLAN: post op Abx, DVT px: SCD's, TED's, Early ambulation and chemical px

## 2020-07-02 ENCOUNTER — Encounter (HOSPITAL_COMMUNITY): Payer: Self-pay | Admitting: Orthopedic Surgery

## 2020-07-02 DIAGNOSIS — M1712 Unilateral primary osteoarthritis, left knee: Secondary | ICD-10-CM | POA: Diagnosis not present

## 2020-07-02 DIAGNOSIS — Z96652 Presence of left artificial knee joint: Secondary | ICD-10-CM | POA: Diagnosis not present

## 2020-07-02 NOTE — Anesthesia Postprocedure Evaluation (Signed)
Anesthesia Post Note  Patient: Wendy Blair  Procedure(s) Performed: TOTAL KNEE ARTHROPLASTY (Left Knee)     Patient location during evaluation: PACU Anesthesia Type: Spinal Level of consciousness: oriented and awake and alert Pain management: pain level controlled Vital Signs Assessment: post-procedure vital signs reviewed and stable Respiratory status: spontaneous breathing, respiratory function stable and patient connected to nasal cannula oxygen Cardiovascular status: blood pressure returned to baseline and stable Postop Assessment: no headache, no backache and no apparent nausea or vomiting Anesthetic complications: no   No complications documented.  Last Vitals:  Vitals:   07/01/20 1500 07/01/20 1520  BP: (!) 105/53 (!) 103/58  Pulse:    Resp:    Temp:    SpO2: 94%                    Audry Pili

## 2020-07-09 DIAGNOSIS — M6281 Muscle weakness (generalized): Secondary | ICD-10-CM | POA: Diagnosis not present

## 2020-07-09 DIAGNOSIS — M25462 Effusion, left knee: Secondary | ICD-10-CM | POA: Diagnosis not present

## 2020-07-09 DIAGNOSIS — M25662 Stiffness of left knee, not elsewhere classified: Secondary | ICD-10-CM | POA: Diagnosis not present

## 2020-07-09 DIAGNOSIS — R262 Difficulty in walking, not elsewhere classified: Secondary | ICD-10-CM | POA: Diagnosis not present

## 2020-07-11 DIAGNOSIS — M6281 Muscle weakness (generalized): Secondary | ICD-10-CM | POA: Diagnosis not present

## 2020-07-11 DIAGNOSIS — R262 Difficulty in walking, not elsewhere classified: Secondary | ICD-10-CM | POA: Diagnosis not present

## 2020-07-11 DIAGNOSIS — M25662 Stiffness of left knee, not elsewhere classified: Secondary | ICD-10-CM | POA: Diagnosis not present

## 2020-07-11 DIAGNOSIS — M25462 Effusion, left knee: Secondary | ICD-10-CM | POA: Diagnosis not present

## 2020-07-14 DIAGNOSIS — M25662 Stiffness of left knee, not elsewhere classified: Secondary | ICD-10-CM | POA: Diagnosis not present

## 2020-07-14 DIAGNOSIS — R262 Difficulty in walking, not elsewhere classified: Secondary | ICD-10-CM | POA: Diagnosis not present

## 2020-07-14 DIAGNOSIS — M6281 Muscle weakness (generalized): Secondary | ICD-10-CM | POA: Diagnosis not present

## 2020-07-14 DIAGNOSIS — M25562 Pain in left knee: Secondary | ICD-10-CM | POA: Diagnosis not present

## 2020-07-16 DIAGNOSIS — M25562 Pain in left knee: Secondary | ICD-10-CM | POA: Diagnosis not present

## 2020-07-16 DIAGNOSIS — M25662 Stiffness of left knee, not elsewhere classified: Secondary | ICD-10-CM | POA: Diagnosis not present

## 2020-07-16 DIAGNOSIS — M6281 Muscle weakness (generalized): Secondary | ICD-10-CM | POA: Diagnosis not present

## 2020-07-16 DIAGNOSIS — M25462 Effusion, left knee: Secondary | ICD-10-CM | POA: Diagnosis not present

## 2020-07-16 DIAGNOSIS — R262 Difficulty in walking, not elsewhere classified: Secondary | ICD-10-CM | POA: Diagnosis not present

## 2020-07-17 ENCOUNTER — Other Ambulatory Visit (HOSPITAL_COMMUNITY): Payer: Self-pay | Admitting: Orthopedic Surgery

## 2020-07-17 MED FILL — HYDROCODON-APAP 5-325: 5-325 | 3 days supply | Qty: 20 | Fill #0

## 2020-07-22 DIAGNOSIS — R262 Difficulty in walking, not elsewhere classified: Secondary | ICD-10-CM | POA: Diagnosis not present

## 2020-07-22 DIAGNOSIS — M6281 Muscle weakness (generalized): Secondary | ICD-10-CM | POA: Diagnosis not present

## 2020-07-22 DIAGNOSIS — M25462 Effusion, left knee: Secondary | ICD-10-CM | POA: Diagnosis not present

## 2020-07-22 DIAGNOSIS — M25662 Stiffness of left knee, not elsewhere classified: Secondary | ICD-10-CM | POA: Diagnosis not present

## 2020-07-24 DIAGNOSIS — R262 Difficulty in walking, not elsewhere classified: Secondary | ICD-10-CM | POA: Diagnosis not present

## 2020-07-24 DIAGNOSIS — M25462 Effusion, left knee: Secondary | ICD-10-CM | POA: Diagnosis not present

## 2020-07-24 DIAGNOSIS — M25662 Stiffness of left knee, not elsewhere classified: Secondary | ICD-10-CM | POA: Diagnosis not present

## 2020-07-24 DIAGNOSIS — M6281 Muscle weakness (generalized): Secondary | ICD-10-CM | POA: Diagnosis not present

## 2020-07-28 ENCOUNTER — Other Ambulatory Visit: Payer: Self-pay | Admitting: Cardiology

## 2020-07-28 ENCOUNTER — Other Ambulatory Visit (HOSPITAL_COMMUNITY): Payer: Self-pay | Admitting: Orthopedic Surgery

## 2020-07-28 DIAGNOSIS — M25462 Effusion, left knee: Secondary | ICD-10-CM | POA: Diagnosis not present

## 2020-07-28 DIAGNOSIS — M6281 Muscle weakness (generalized): Secondary | ICD-10-CM | POA: Diagnosis not present

## 2020-07-28 DIAGNOSIS — R262 Difficulty in walking, not elsewhere classified: Secondary | ICD-10-CM | POA: Diagnosis not present

## 2020-07-28 DIAGNOSIS — M25662 Stiffness of left knee, not elsewhere classified: Secondary | ICD-10-CM | POA: Diagnosis not present

## 2020-07-31 DIAGNOSIS — M25662 Stiffness of left knee, not elsewhere classified: Secondary | ICD-10-CM | POA: Diagnosis not present

## 2020-07-31 DIAGNOSIS — M6281 Muscle weakness (generalized): Secondary | ICD-10-CM | POA: Diagnosis not present

## 2020-07-31 DIAGNOSIS — M25462 Effusion, left knee: Secondary | ICD-10-CM | POA: Diagnosis not present

## 2020-07-31 DIAGNOSIS — R262 Difficulty in walking, not elsewhere classified: Secondary | ICD-10-CM | POA: Diagnosis not present

## 2020-08-04 ENCOUNTER — Other Ambulatory Visit (HOSPITAL_COMMUNITY): Payer: Self-pay | Admitting: Ophthalmology

## 2020-08-04 MED FILL — PREDNISOLONE AC 1% EYE DROP: 1 | 28 days supply | Qty: 5 | Fill #0

## 2020-08-04 MED FILL — OFLOXACIN 0.3% EYE DROPS: 0.3 | 28 days supply | Qty: 5 | Fill #0

## 2020-08-05 DIAGNOSIS — M6281 Muscle weakness (generalized): Secondary | ICD-10-CM | POA: Diagnosis not present

## 2020-08-05 DIAGNOSIS — M25662 Stiffness of left knee, not elsewhere classified: Secondary | ICD-10-CM | POA: Diagnosis not present

## 2020-08-05 DIAGNOSIS — M25562 Pain in left knee: Secondary | ICD-10-CM | POA: Diagnosis not present

## 2020-08-05 DIAGNOSIS — R262 Difficulty in walking, not elsewhere classified: Secondary | ICD-10-CM | POA: Diagnosis not present

## 2020-08-07 DIAGNOSIS — M6281 Muscle weakness (generalized): Secondary | ICD-10-CM | POA: Diagnosis not present

## 2020-08-07 DIAGNOSIS — R262 Difficulty in walking, not elsewhere classified: Secondary | ICD-10-CM | POA: Diagnosis not present

## 2020-08-07 DIAGNOSIS — M25562 Pain in left knee: Secondary | ICD-10-CM | POA: Diagnosis not present

## 2020-08-07 DIAGNOSIS — M25462 Effusion, left knee: Secondary | ICD-10-CM | POA: Diagnosis not present

## 2020-08-08 DIAGNOSIS — H25811 Combined forms of age-related cataract, right eye: Secondary | ICD-10-CM | POA: Diagnosis not present

## 2020-08-08 DIAGNOSIS — H2511 Age-related nuclear cataract, right eye: Secondary | ICD-10-CM | POA: Diagnosis not present

## 2020-08-11 ENCOUNTER — Other Ambulatory Visit (HOSPITAL_COMMUNITY): Payer: Self-pay | Admitting: Family Medicine

## 2020-08-11 MED FILL — SIMVASTATIN 20 MG TABLET: 20 | 90 days supply | Qty: 90 | Fill #0

## 2020-08-12 DIAGNOSIS — M25662 Stiffness of left knee, not elsewhere classified: Secondary | ICD-10-CM | POA: Diagnosis not present

## 2020-08-12 DIAGNOSIS — R262 Difficulty in walking, not elsewhere classified: Secondary | ICD-10-CM | POA: Diagnosis not present

## 2020-08-12 DIAGNOSIS — M25462 Effusion, left knee: Secondary | ICD-10-CM | POA: Diagnosis not present

## 2020-08-12 DIAGNOSIS — M6281 Muscle weakness (generalized): Secondary | ICD-10-CM | POA: Diagnosis not present

## 2020-08-13 DIAGNOSIS — M25462 Effusion, left knee: Secondary | ICD-10-CM | POA: Diagnosis not present

## 2020-08-15 ENCOUNTER — Other Ambulatory Visit (HOSPITAL_COMMUNITY): Payer: Self-pay | Admitting: Ophthalmology

## 2020-08-15 DIAGNOSIS — R262 Difficulty in walking, not elsewhere classified: Secondary | ICD-10-CM | POA: Diagnosis not present

## 2020-08-15 DIAGNOSIS — M25662 Stiffness of left knee, not elsewhere classified: Secondary | ICD-10-CM | POA: Diagnosis not present

## 2020-08-15 DIAGNOSIS — M25462 Effusion, left knee: Secondary | ICD-10-CM | POA: Diagnosis not present

## 2020-08-15 DIAGNOSIS — M6281 Muscle weakness (generalized): Secondary | ICD-10-CM | POA: Diagnosis not present

## 2020-08-16 ENCOUNTER — Other Ambulatory Visit (HOSPITAL_COMMUNITY): Payer: Self-pay

## 2020-08-16 MED FILL — Ofloxacin Ophth Soln 0.3%: OPHTHALMIC | 28 days supply | Qty: 5 | Fill #0 | Status: CN

## 2020-08-18 DIAGNOSIS — R262 Difficulty in walking, not elsewhere classified: Secondary | ICD-10-CM | POA: Diagnosis not present

## 2020-08-18 DIAGNOSIS — M25662 Stiffness of left knee, not elsewhere classified: Secondary | ICD-10-CM | POA: Diagnosis not present

## 2020-08-18 DIAGNOSIS — M25462 Effusion, left knee: Secondary | ICD-10-CM | POA: Diagnosis not present

## 2020-08-18 DIAGNOSIS — M6281 Muscle weakness (generalized): Secondary | ICD-10-CM | POA: Diagnosis not present

## 2020-08-21 ENCOUNTER — Other Ambulatory Visit (HOSPITAL_COMMUNITY): Payer: Self-pay

## 2020-08-22 DIAGNOSIS — H2512 Age-related nuclear cataract, left eye: Secondary | ICD-10-CM | POA: Diagnosis not present

## 2020-08-22 DIAGNOSIS — H25812 Combined forms of age-related cataract, left eye: Secondary | ICD-10-CM | POA: Diagnosis not present

## 2020-08-27 ENCOUNTER — Other Ambulatory Visit (HOSPITAL_COMMUNITY): Payer: Self-pay

## 2020-08-27 ENCOUNTER — Other Ambulatory Visit (HOSPITAL_COMMUNITY): Payer: Self-pay | Admitting: Ophthalmology

## 2020-08-28 ENCOUNTER — Other Ambulatory Visit (HOSPITAL_COMMUNITY): Payer: Self-pay

## 2020-09-02 ENCOUNTER — Other Ambulatory Visit (HOSPITAL_COMMUNITY): Payer: Self-pay

## 2020-09-02 MED ORDER — OFLOXACIN 0.3 % OP SOLN
OPHTHALMIC | 0 refills | Status: AC
  Filled 2020-09-02: qty 5, 28d supply, fill #0

## 2020-09-02 MED ORDER — PREDNISOLONE ACETATE 1 % OP SUSP
OPHTHALMIC | 0 refills | Status: AC
  Filled 2020-09-02: qty 5, 28d supply, fill #0

## 2020-09-03 ENCOUNTER — Other Ambulatory Visit (HOSPITAL_COMMUNITY): Payer: Self-pay

## 2020-09-08 ENCOUNTER — Other Ambulatory Visit (HOSPITAL_COMMUNITY): Payer: Self-pay

## 2020-09-08 MED ORDER — PREDNISOLONE ACETATE 1 % OP SUSP
OPHTHALMIC | 0 refills | Status: AC
  Filled 2020-09-08: qty 5, fill #0

## 2020-09-08 MED ORDER — OFLOXACIN 0.3 % OP SOLN
OPHTHALMIC | 0 refills | Status: AC
  Filled 2020-09-08: qty 5, 28d supply, fill #0

## 2020-09-09 ENCOUNTER — Other Ambulatory Visit (HOSPITAL_COMMUNITY): Payer: Self-pay

## 2020-09-09 MED FILL — Levothyroxine Sodium Tab 50 MCG: ORAL | 90 days supply | Qty: 90 | Fill #0 | Status: AC

## 2020-09-09 MED FILL — Amlodipine Besylate Tab 10 MG (Base Equivalent): ORAL | 90 days supply | Qty: 90 | Fill #0 | Status: AC

## 2020-09-23 MED FILL — Sertraline HCl Tab 50 MG: ORAL | 90 days supply | Qty: 90 | Fill #0 | Status: AC

## 2020-09-23 MED FILL — Metoprolol Tartrate Tab 50 MG: ORAL | 90 days supply | Qty: 180 | Fill #0 | Status: AC

## 2020-09-24 ENCOUNTER — Other Ambulatory Visit (HOSPITAL_COMMUNITY): Payer: Self-pay

## 2020-10-29 ENCOUNTER — Other Ambulatory Visit (HOSPITAL_COMMUNITY): Payer: Self-pay

## 2020-10-29 MED FILL — Hydrochlorothiazide Tab 25 MG: ORAL | 90 days supply | Qty: 90 | Fill #0 | Status: AC

## 2020-11-11 MED FILL — Simvastatin Tab 20 MG: ORAL | 90 days supply | Qty: 90 | Fill #0 | Status: AC

## 2020-11-12 ENCOUNTER — Other Ambulatory Visit (HOSPITAL_COMMUNITY): Payer: Self-pay

## 2020-12-06 ENCOUNTER — Other Ambulatory Visit (HOSPITAL_COMMUNITY): Payer: Self-pay

## 2020-12-06 MED FILL — Amlodipine Besylate Tab 10 MG (Base Equivalent): ORAL | 90 days supply | Qty: 90 | Fill #1 | Status: AC

## 2020-12-08 ENCOUNTER — Other Ambulatory Visit (HOSPITAL_COMMUNITY): Payer: Self-pay

## 2020-12-10 ENCOUNTER — Other Ambulatory Visit (HOSPITAL_COMMUNITY): Payer: Self-pay

## 2020-12-10 MED ORDER — LEVOTHYROXINE SODIUM 50 MCG PO TABS
ORAL_TABLET | ORAL | 0 refills | Status: DC
  Filled 2020-12-10: qty 30, 30d supply, fill #0

## 2020-12-24 ENCOUNTER — Other Ambulatory Visit (HOSPITAL_COMMUNITY): Payer: Self-pay

## 2020-12-27 ENCOUNTER — Other Ambulatory Visit (HOSPITAL_COMMUNITY): Payer: Self-pay

## 2020-12-29 ENCOUNTER — Other Ambulatory Visit (HOSPITAL_COMMUNITY): Payer: Self-pay

## 2020-12-29 MED ORDER — METOPROLOL TARTRATE 50 MG PO TABS
50.0000 mg | ORAL_TABLET | Freq: Two times a day (BID) | ORAL | 0 refills | Status: AC
  Filled 2020-12-29: qty 60, 30d supply, fill #0

## 2020-12-29 MED ORDER — SERTRALINE HCL 50 MG PO TABS
50.0000 mg | ORAL_TABLET | Freq: Every day | ORAL | 0 refills | Status: AC
  Filled 2020-12-29: qty 30, 30d supply, fill #0

## 2020-12-31 ENCOUNTER — Other Ambulatory Visit (HOSPITAL_COMMUNITY): Payer: Self-pay

## 2020-12-31 DIAGNOSIS — I340 Nonrheumatic mitral (valve) insufficiency: Secondary | ICD-10-CM | POA: Diagnosis not present

## 2020-12-31 DIAGNOSIS — E7800 Pure hypercholesterolemia, unspecified: Secondary | ICD-10-CM | POA: Diagnosis not present

## 2020-12-31 DIAGNOSIS — Z79899 Other long term (current) drug therapy: Secondary | ICD-10-CM | POA: Diagnosis not present

## 2020-12-31 DIAGNOSIS — E039 Hypothyroidism, unspecified: Secondary | ICD-10-CM | POA: Diagnosis not present

## 2020-12-31 DIAGNOSIS — I700 Atherosclerosis of aorta: Secondary | ICD-10-CM | POA: Diagnosis not present

## 2020-12-31 DIAGNOSIS — I10 Essential (primary) hypertension: Secondary | ICD-10-CM | POA: Diagnosis not present

## 2020-12-31 DIAGNOSIS — J449 Chronic obstructive pulmonary disease, unspecified: Secondary | ICD-10-CM | POA: Diagnosis not present

## 2020-12-31 DIAGNOSIS — R7301 Impaired fasting glucose: Secondary | ICD-10-CM | POA: Diagnosis not present

## 2020-12-31 DIAGNOSIS — Z0000 Encounter for general adult medical examination without abnormal findings: Secondary | ICD-10-CM | POA: Diagnosis not present

## 2020-12-31 DIAGNOSIS — E559 Vitamin D deficiency, unspecified: Secondary | ICD-10-CM | POA: Diagnosis not present

## 2020-12-31 MED ORDER — ALBUTEROL SULFATE HFA 108 (90 BASE) MCG/ACT IN AERS
INHALATION_SPRAY | RESPIRATORY_TRACT | 2 refills | Status: AC
  Filled 2020-12-31: qty 8.5, 16d supply, fill #0

## 2020-12-31 MED ORDER — BREZTRI AEROSPHERE 160-9-4.8 MCG/ACT IN AERO
INHALATION_SPRAY | RESPIRATORY_TRACT | 12 refills | Status: AC
  Filled 2020-12-31: qty 10.7, 30d supply, fill #0

## 2021-01-08 ENCOUNTER — Other Ambulatory Visit (HOSPITAL_COMMUNITY): Payer: Self-pay

## 2021-01-13 ENCOUNTER — Other Ambulatory Visit (HOSPITAL_COMMUNITY): Payer: Self-pay

## 2021-01-14 ENCOUNTER — Other Ambulatory Visit (HOSPITAL_COMMUNITY): Payer: Self-pay

## 2021-01-14 MED ORDER — LEVOTHYROXINE SODIUM 50 MCG PO TABS
ORAL_TABLET | ORAL | 4 refills | Status: DC
  Filled 2021-01-14: qty 90, 90d supply, fill #0
  Filled 2021-04-14: qty 90, 90d supply, fill #1
  Filled 2021-07-14: qty 90, 90d supply, fill #2
  Filled 2021-10-14: qty 90, 90d supply, fill #3
  Filled 2022-01-13: qty 90, 90d supply, fill #4

## 2021-01-14 MED ORDER — LEVOTHYROXINE SODIUM 50 MCG PO TABS
ORAL_TABLET | ORAL | 4 refills | Status: AC
  Filled 2021-01-14: qty 90, 90d supply, fill #0

## 2021-01-15 ENCOUNTER — Other Ambulatory Visit (HOSPITAL_COMMUNITY): Payer: Self-pay

## 2021-01-24 ENCOUNTER — Other Ambulatory Visit (HOSPITAL_COMMUNITY): Payer: Self-pay

## 2021-01-26 ENCOUNTER — Other Ambulatory Visit (HOSPITAL_COMMUNITY): Payer: Self-pay

## 2021-01-26 MED ORDER — HYDROCHLOROTHIAZIDE 25 MG PO TABS
ORAL_TABLET | ORAL | 2 refills | Status: DC
  Filled 2021-01-26: qty 90, 90d supply, fill #0
  Filled 2021-05-27: qty 90, 90d supply, fill #1
  Filled 2021-08-18: qty 90, 90d supply, fill #2

## 2021-01-26 MED ORDER — METOPROLOL TARTRATE 50 MG PO TABS
ORAL_TABLET | ORAL | 2 refills | Status: DC
  Filled 2021-01-26: qty 180, 90d supply, fill #0
  Filled 2021-04-29: qty 180, 90d supply, fill #1
  Filled 2021-07-28: qty 180, 90d supply, fill #2

## 2021-01-26 MED ORDER — SERTRALINE HCL 50 MG PO TABS
ORAL_TABLET | ORAL | 2 refills | Status: DC
  Filled 2021-01-26: qty 90, 90d supply, fill #0
  Filled 2021-04-29: qty 90, 90d supply, fill #1
  Filled 2021-07-28: qty 90, 90d supply, fill #2

## 2021-01-28 ENCOUNTER — Other Ambulatory Visit (HOSPITAL_COMMUNITY): Payer: Self-pay

## 2021-02-10 ENCOUNTER — Other Ambulatory Visit (HOSPITAL_COMMUNITY): Payer: Self-pay

## 2021-02-10 MED ORDER — SIMVASTATIN 20 MG PO TABS
ORAL_TABLET | ORAL | 3 refills | Status: DC
  Filled 2021-02-10: qty 90, 90d supply, fill #0
  Filled 2021-05-06: qty 90, 90d supply, fill #1
  Filled 2021-08-18: qty 90, 90d supply, fill #2
  Filled 2021-11-22: qty 90, 90d supply, fill #3

## 2021-02-16 ENCOUNTER — Other Ambulatory Visit (HOSPITAL_COMMUNITY): Payer: Self-pay

## 2021-03-14 ENCOUNTER — Other Ambulatory Visit (HOSPITAL_COMMUNITY): Payer: Self-pay

## 2021-03-17 ENCOUNTER — Other Ambulatory Visit (HOSPITAL_COMMUNITY): Payer: Self-pay

## 2021-03-18 ENCOUNTER — Other Ambulatory Visit (HOSPITAL_COMMUNITY): Payer: Self-pay

## 2021-03-18 MED ORDER — AMLODIPINE BESYLATE 10 MG PO TABS
ORAL_TABLET | ORAL | 4 refills | Status: AC
  Filled 2021-03-18: qty 90, 90d supply, fill #0
  Filled 2021-06-14: qty 90, 90d supply, fill #1
  Filled 2021-09-20: qty 90, 90d supply, fill #2
  Filled 2021-12-21: qty 90, 90d supply, fill #3

## 2021-03-24 DIAGNOSIS — Z23 Encounter for immunization: Secondary | ICD-10-CM | POA: Diagnosis not present

## 2021-04-14 ENCOUNTER — Other Ambulatory Visit (HOSPITAL_COMMUNITY): Payer: Self-pay

## 2021-04-28 ENCOUNTER — Other Ambulatory Visit: Payer: Self-pay | Admitting: Acute Care

## 2021-04-28 DIAGNOSIS — Z87891 Personal history of nicotine dependence: Secondary | ICD-10-CM

## 2021-04-28 DIAGNOSIS — F17210 Nicotine dependence, cigarettes, uncomplicated: Secondary | ICD-10-CM

## 2021-04-29 ENCOUNTER — Other Ambulatory Visit (HOSPITAL_COMMUNITY): Payer: Self-pay

## 2021-05-07 ENCOUNTER — Other Ambulatory Visit (HOSPITAL_COMMUNITY): Payer: Self-pay

## 2021-05-27 ENCOUNTER — Other Ambulatory Visit (HOSPITAL_COMMUNITY): Payer: Self-pay

## 2021-06-14 ENCOUNTER — Other Ambulatory Visit (HOSPITAL_COMMUNITY): Payer: Self-pay

## 2021-06-15 ENCOUNTER — Other Ambulatory Visit (HOSPITAL_COMMUNITY): Payer: Self-pay

## 2021-07-02 ENCOUNTER — Other Ambulatory Visit (HOSPITAL_COMMUNITY): Payer: Self-pay

## 2021-07-02 DIAGNOSIS — I700 Atherosclerosis of aorta: Secondary | ICD-10-CM | POA: Diagnosis not present

## 2021-07-02 DIAGNOSIS — I10 Essential (primary) hypertension: Secondary | ICD-10-CM | POA: Diagnosis not present

## 2021-07-02 DIAGNOSIS — J439 Emphysema, unspecified: Secondary | ICD-10-CM | POA: Diagnosis not present

## 2021-07-02 DIAGNOSIS — E039 Hypothyroidism, unspecified: Secondary | ICD-10-CM | POA: Diagnosis not present

## 2021-07-02 DIAGNOSIS — J441 Chronic obstructive pulmonary disease with (acute) exacerbation: Secondary | ICD-10-CM | POA: Diagnosis not present

## 2021-07-02 MED ORDER — ALBUTEROL SULFATE HFA 108 (90 BASE) MCG/ACT IN AERS
INHALATION_SPRAY | RESPIRATORY_TRACT | 2 refills | Status: AC
  Filled 2021-07-02: qty 8.5, 30d supply, fill #0

## 2021-07-09 DIAGNOSIS — E785 Hyperlipidemia, unspecified: Secondary | ICD-10-CM | POA: Diagnosis not present

## 2021-07-09 DIAGNOSIS — I10 Essential (primary) hypertension: Secondary | ICD-10-CM | POA: Diagnosis not present

## 2021-07-09 DIAGNOSIS — F32A Depression, unspecified: Secondary | ICD-10-CM | POA: Diagnosis not present

## 2021-07-09 DIAGNOSIS — Z1211 Encounter for screening for malignant neoplasm of colon: Secondary | ICD-10-CM | POA: Diagnosis not present

## 2021-07-10 ENCOUNTER — Other Ambulatory Visit: Payer: Self-pay | Admitting: Family Medicine

## 2021-07-10 ENCOUNTER — Other Ambulatory Visit (HOSPITAL_COMMUNITY): Payer: Self-pay

## 2021-07-10 DIAGNOSIS — E2839 Other primary ovarian failure: Secondary | ICD-10-CM

## 2021-07-13 ENCOUNTER — Other Ambulatory Visit (HOSPITAL_COMMUNITY): Payer: Self-pay

## 2021-07-13 MED ORDER — CLENPIQ 10-3.5-12 MG-GM -GM/160ML PO SOLN
ORAL | 0 refills | Status: AC
  Filled 2021-07-13: qty 320, 1d supply, fill #0

## 2021-07-14 ENCOUNTER — Other Ambulatory Visit (HOSPITAL_COMMUNITY): Payer: Self-pay

## 2021-07-21 ENCOUNTER — Other Ambulatory Visit: Payer: Self-pay | Admitting: Family Medicine

## 2021-07-21 DIAGNOSIS — Z1231 Encounter for screening mammogram for malignant neoplasm of breast: Secondary | ICD-10-CM

## 2021-07-23 ENCOUNTER — Other Ambulatory Visit: Payer: Self-pay

## 2021-07-23 ENCOUNTER — Ambulatory Visit
Admission: RE | Admit: 2021-07-23 | Discharge: 2021-07-23 | Disposition: A | Discharge status: Home / Self Care | Payer: Medicare Other | Source: Ambulatory Visit | Attending: Family Medicine | Admitting: Family Medicine

## 2021-07-23 DIAGNOSIS — Z1231 Encounter for screening mammogram for malignant neoplasm of breast: Secondary | ICD-10-CM

## 2021-07-28 ENCOUNTER — Other Ambulatory Visit (HOSPITAL_COMMUNITY): Payer: Self-pay

## 2021-08-18 ENCOUNTER — Other Ambulatory Visit (HOSPITAL_COMMUNITY): Payer: Self-pay

## 2021-09-21 ENCOUNTER — Other Ambulatory Visit (HOSPITAL_COMMUNITY): Payer: Self-pay

## 2021-09-30 DIAGNOSIS — I10 Essential (primary) hypertension: Secondary | ICD-10-CM | POA: Diagnosis not present

## 2021-09-30 DIAGNOSIS — H26493 Other secondary cataract, bilateral: Secondary | ICD-10-CM | POA: Diagnosis not present

## 2021-09-30 DIAGNOSIS — H35033 Hypertensive retinopathy, bilateral: Secondary | ICD-10-CM | POA: Diagnosis not present

## 2021-10-07 DIAGNOSIS — K635 Polyp of colon: Secondary | ICD-10-CM | POA: Diagnosis not present

## 2021-10-07 DIAGNOSIS — K5730 Diverticulosis of large intestine without perforation or abscess without bleeding: Secondary | ICD-10-CM | POA: Diagnosis not present

## 2021-10-07 DIAGNOSIS — K648 Other hemorrhoids: Secondary | ICD-10-CM | POA: Diagnosis not present

## 2021-10-07 DIAGNOSIS — D122 Benign neoplasm of ascending colon: Secondary | ICD-10-CM | POA: Diagnosis not present

## 2021-10-07 DIAGNOSIS — Z1211 Encounter for screening for malignant neoplasm of colon: Secondary | ICD-10-CM | POA: Diagnosis not present

## 2021-10-14 ENCOUNTER — Other Ambulatory Visit (HOSPITAL_COMMUNITY): Payer: Self-pay

## 2021-10-25 ENCOUNTER — Other Ambulatory Visit (HOSPITAL_COMMUNITY): Payer: Self-pay

## 2021-10-27 ENCOUNTER — Other Ambulatory Visit (HOSPITAL_COMMUNITY): Payer: Self-pay

## 2021-10-27 MED ORDER — METOPROLOL TARTRATE 50 MG PO TABS
ORAL_TABLET | ORAL | 3 refills | Status: DC
  Filled 2021-10-27: qty 180, 90d supply, fill #0
  Filled 2022-01-26: qty 180, 90d supply, fill #1
  Filled 2022-04-27: qty 180, 90d supply, fill #2
  Filled 2022-07-27: qty 180, 90d supply, fill #3

## 2021-10-27 MED ORDER — SERTRALINE HCL 50 MG PO TABS
ORAL_TABLET | ORAL | 3 refills | Status: DC
  Filled 2021-10-27: qty 90, 90d supply, fill #0
  Filled 2022-01-26: qty 90, 90d supply, fill #1
  Filled 2022-04-27: qty 90, 90d supply, fill #2
  Filled 2022-07-27: qty 90, 90d supply, fill #3

## 2021-11-16 IMAGING — DX DG KNEE 1-2V PORT*L*
2 series · 2 of 2 positions shown · non-contrast
Comparison: None.

CLINICAL DATA: Total knee arthroplasty

EXAM:
PORTABLE LEFT KNEE - 1-2 VIEW

[knee lat]
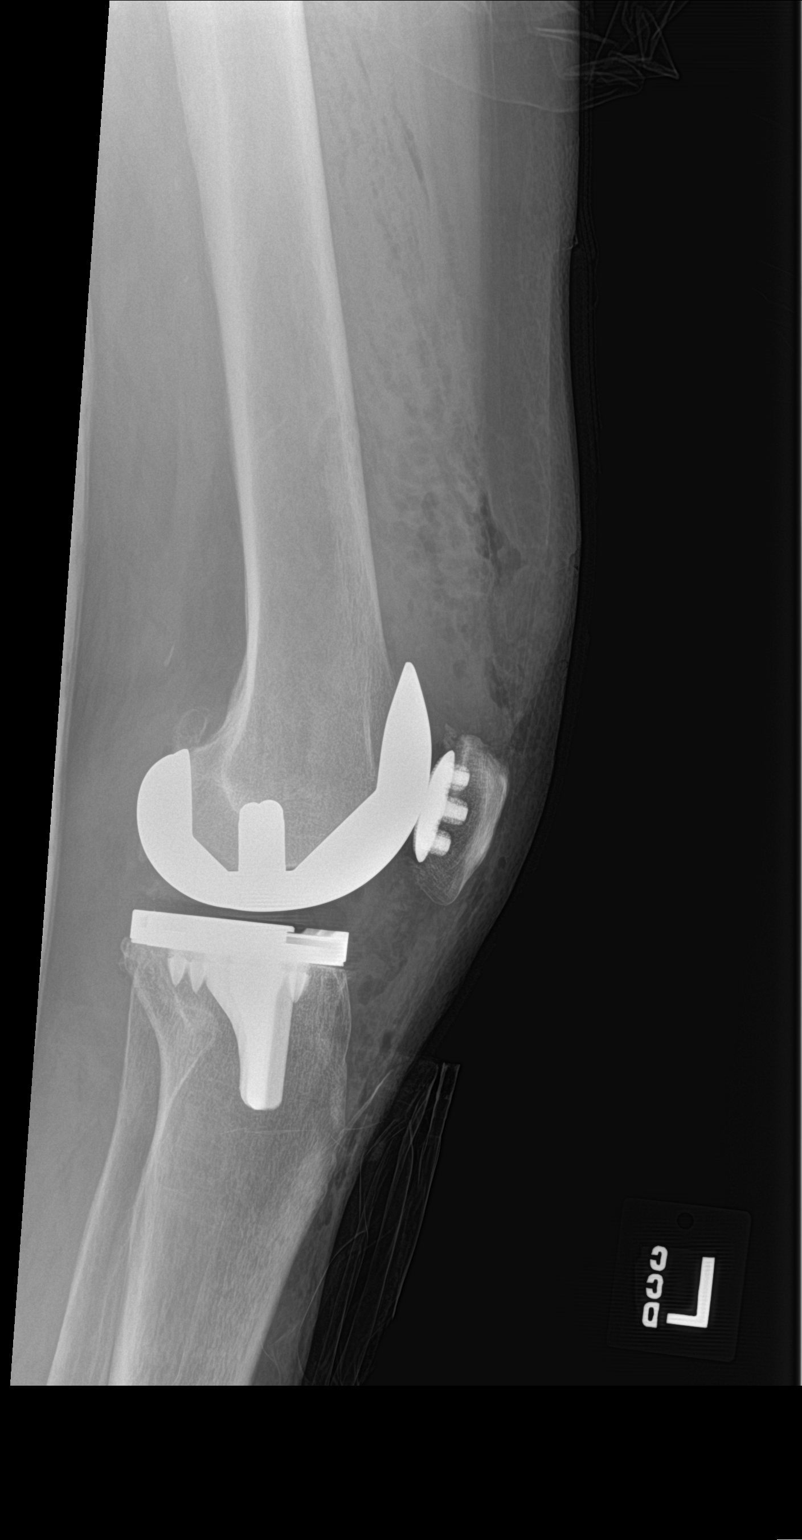

[knee ap]
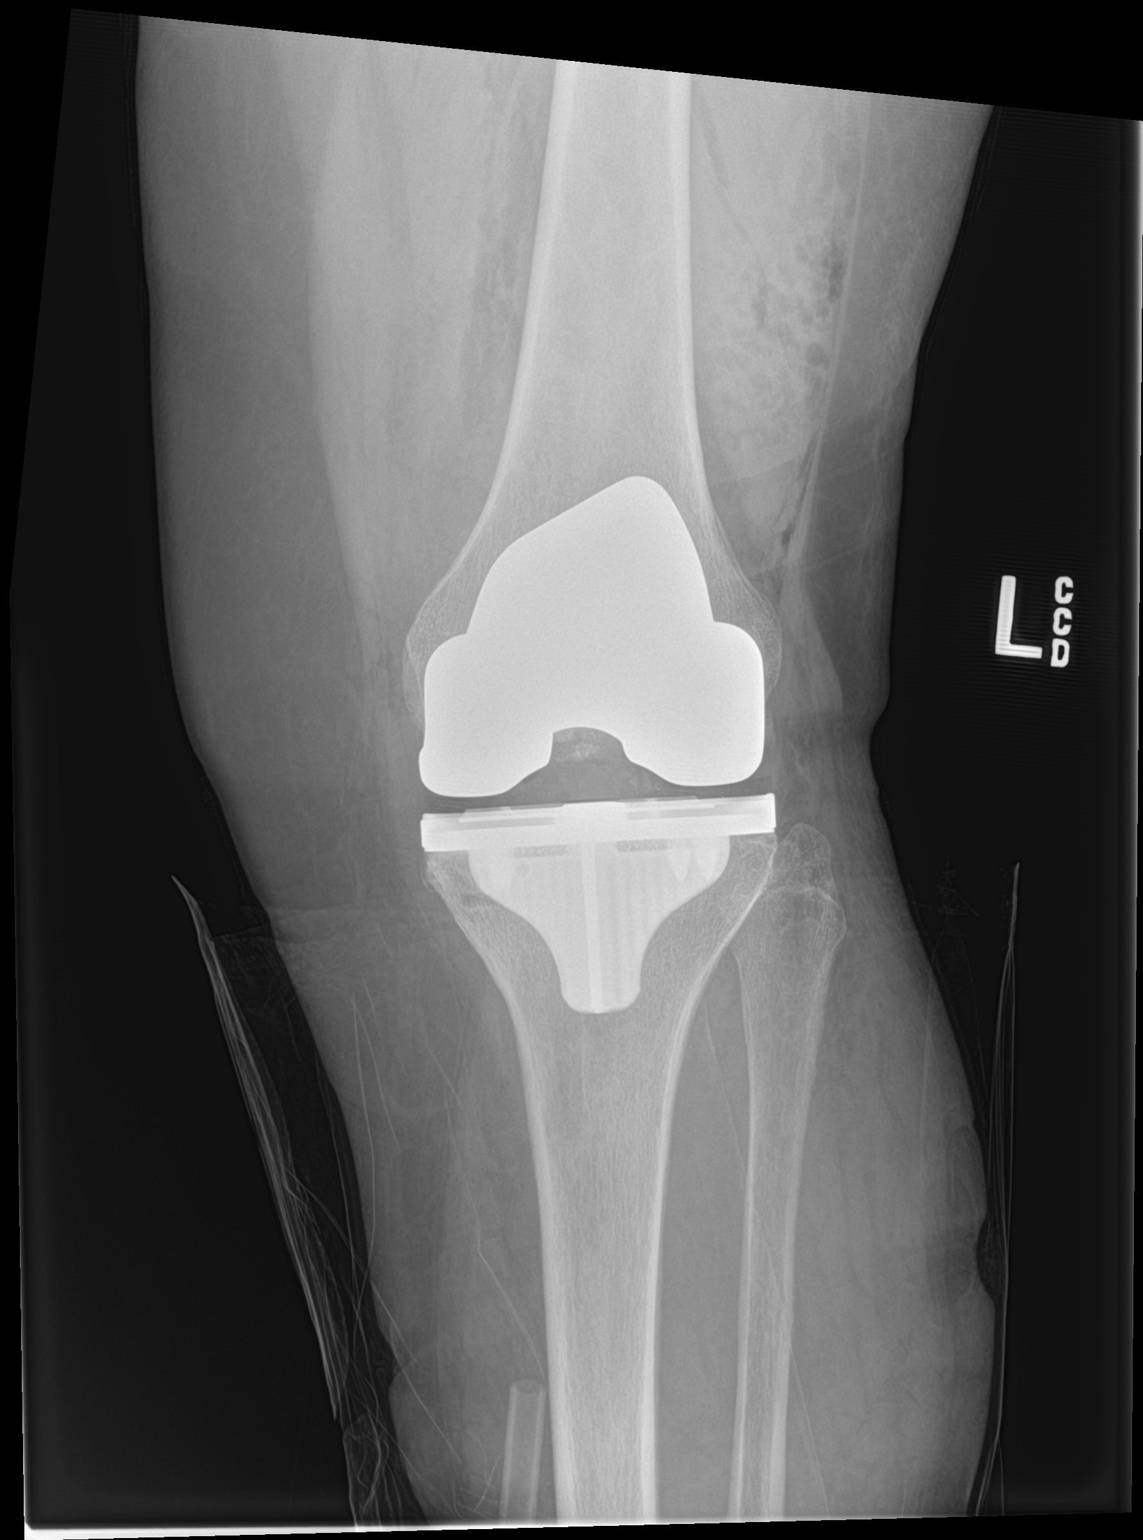

[2 of 2 positions shown; findings below may reference images not displayed]

FINDINGS: Total knee arthroplasty which is well seated. No fracture or
subluxation seen. Expected soft tissue gas.
IMPRESSION: Total knee arthroplasty without complicating feature.

## 2021-11-21 ENCOUNTER — Other Ambulatory Visit (HOSPITAL_COMMUNITY): Payer: Self-pay

## 2021-11-23 ENCOUNTER — Other Ambulatory Visit (HOSPITAL_COMMUNITY): Payer: Self-pay

## 2021-11-24 ENCOUNTER — Other Ambulatory Visit (HOSPITAL_COMMUNITY): Payer: Self-pay

## 2021-11-24 MED ORDER — HYDROCHLOROTHIAZIDE 25 MG PO TABS
25.0000 mg | ORAL_TABLET | Freq: Every morning | ORAL | 0 refills | Status: DC
  Filled 2021-11-24: qty 90, 90d supply, fill #0

## 2021-12-22 ENCOUNTER — Other Ambulatory Visit (HOSPITAL_COMMUNITY): Payer: Self-pay

## 2021-12-22 ENCOUNTER — Ambulatory Visit
Admission: RE | Admit: 2021-12-22 | Discharge: 2021-12-22 | Disposition: A | Discharge status: Home / Self Care | Payer: Medicare Other | Source: Ambulatory Visit | Attending: Family Medicine | Admitting: Family Medicine

## 2021-12-22 DIAGNOSIS — Z780 Asymptomatic menopausal state: Secondary | ICD-10-CM | POA: Diagnosis not present

## 2021-12-22 DIAGNOSIS — E2839 Other primary ovarian failure: Secondary | ICD-10-CM

## 2021-12-22 DIAGNOSIS — M8589 Other specified disorders of bone density and structure, multiple sites: Secondary | ICD-10-CM | POA: Diagnosis not present

## 2022-01-14 ENCOUNTER — Other Ambulatory Visit (HOSPITAL_COMMUNITY): Payer: Self-pay

## 2022-01-15 ENCOUNTER — Other Ambulatory Visit (HOSPITAL_COMMUNITY): Payer: Self-pay

## 2022-01-15 MED ORDER — LEVOTHYROXINE SODIUM 50 MCG PO TABS
ORAL_TABLET | ORAL | 0 refills | Status: AC
  Filled 2022-01-15 – 2022-04-13 (×2): qty 90, 90d supply, fill #0

## 2022-01-15 MED ORDER — LEVOTHYROXINE SODIUM 50 MCG PO TABS
ORAL_TABLET | ORAL | 0 refills | Status: AC
  Filled 2022-01-15: qty 90, 90d supply, fill #0

## 2022-01-26 ENCOUNTER — Other Ambulatory Visit (HOSPITAL_COMMUNITY): Payer: Self-pay

## 2022-02-04 DIAGNOSIS — E559 Vitamin D deficiency, unspecified: Secondary | ICD-10-CM | POA: Diagnosis not present

## 2022-02-04 DIAGNOSIS — I340 Nonrheumatic mitral (valve) insufficiency: Secondary | ICD-10-CM | POA: Diagnosis not present

## 2022-02-04 DIAGNOSIS — Z79899 Other long term (current) drug therapy: Secondary | ICD-10-CM | POA: Diagnosis not present

## 2022-02-04 DIAGNOSIS — Z0000 Encounter for general adult medical examination without abnormal findings: Secondary | ICD-10-CM | POA: Diagnosis not present

## 2022-02-04 DIAGNOSIS — R7301 Impaired fasting glucose: Secondary | ICD-10-CM | POA: Diagnosis not present

## 2022-02-04 DIAGNOSIS — D692 Other nonthrombocytopenic purpura: Secondary | ICD-10-CM | POA: Diagnosis not present

## 2022-02-04 DIAGNOSIS — I10 Essential (primary) hypertension: Secondary | ICD-10-CM | POA: Diagnosis not present

## 2022-02-04 DIAGNOSIS — I700 Atherosclerosis of aorta: Secondary | ICD-10-CM | POA: Diagnosis not present

## 2022-02-04 DIAGNOSIS — E039 Hypothyroidism, unspecified: Secondary | ICD-10-CM | POA: Diagnosis not present

## 2022-02-04 DIAGNOSIS — F17210 Nicotine dependence, cigarettes, uncomplicated: Secondary | ICD-10-CM | POA: Diagnosis not present

## 2022-02-04 DIAGNOSIS — J449 Chronic obstructive pulmonary disease, unspecified: Secondary | ICD-10-CM | POA: Diagnosis not present

## 2022-02-04 DIAGNOSIS — Z23 Encounter for immunization: Secondary | ICD-10-CM | POA: Diagnosis not present

## 2022-02-04 DIAGNOSIS — E7800 Pure hypercholesterolemia, unspecified: Secondary | ICD-10-CM | POA: Diagnosis not present

## 2022-02-23 ENCOUNTER — Other Ambulatory Visit (HOSPITAL_COMMUNITY): Payer: Self-pay

## 2022-02-23 ENCOUNTER — Other Ambulatory Visit: Payer: Self-pay | Admitting: Family Medicine

## 2022-02-23 DIAGNOSIS — F17210 Nicotine dependence, cigarettes, uncomplicated: Secondary | ICD-10-CM

## 2022-02-24 ENCOUNTER — Other Ambulatory Visit (HOSPITAL_COMMUNITY): Payer: Self-pay

## 2022-02-24 MED ORDER — SIMVASTATIN 20 MG PO TABS
20.0000 mg | ORAL_TABLET | Freq: Every evening | ORAL | 3 refills | Status: DC
  Filled 2022-02-24: qty 90, 90d supply, fill #0
  Filled 2022-05-25: qty 90, 90d supply, fill #1
  Filled 2022-08-17: qty 90, 90d supply, fill #2
  Filled 2022-11-23: qty 90, 90d supply, fill #3

## 2022-02-24 MED ORDER — HYDROCHLOROTHIAZIDE 25 MG PO TABS
25.0000 mg | ORAL_TABLET | Freq: Every morning | ORAL | 3 refills | Status: DC
  Filled 2022-02-24: qty 90, 90d supply, fill #0
  Filled 2022-05-25: qty 90, 90d supply, fill #1
  Filled 2022-08-17: qty 90, 90d supply, fill #2
  Filled 2022-11-23: qty 90, 90d supply, fill #3

## 2022-03-23 ENCOUNTER — Other Ambulatory Visit (HOSPITAL_COMMUNITY): Payer: Self-pay

## 2022-03-25 ENCOUNTER — Other Ambulatory Visit (HOSPITAL_COMMUNITY): Payer: Self-pay

## 2022-03-26 ENCOUNTER — Other Ambulatory Visit (HOSPITAL_COMMUNITY): Payer: Self-pay

## 2022-03-26 MED ORDER — AMLODIPINE BESYLATE 10 MG PO TABS
10.0000 mg | ORAL_TABLET | Freq: Every day | ORAL | 4 refills | Status: DC
  Filled 2022-03-26: qty 90, 90d supply, fill #0
  Filled 2022-06-24: qty 90, 90d supply, fill #1
  Filled 2022-09-28: qty 90, 90d supply, fill #2
  Filled 2022-12-26: qty 90, 90d supply, fill #3

## 2022-03-26 MED ORDER — ALBUTEROL SULFATE HFA 108 (90 BASE) MCG/ACT IN AERS
2.0000 | INHALATION_SPRAY | RESPIRATORY_TRACT | 1 refills | Status: AC
  Filled 2022-03-26: qty 6.7, 17d supply, fill #0

## 2022-03-27 ENCOUNTER — Other Ambulatory Visit (HOSPITAL_COMMUNITY): Payer: Self-pay

## 2022-03-31 ENCOUNTER — Other Ambulatory Visit (HOSPITAL_COMMUNITY): Payer: Self-pay

## 2022-04-12 ENCOUNTER — Other Ambulatory Visit (HOSPITAL_COMMUNITY): Payer: Self-pay

## 2022-04-13 ENCOUNTER — Other Ambulatory Visit (HOSPITAL_COMMUNITY): Payer: Self-pay

## 2022-04-20 ENCOUNTER — Other Ambulatory Visit (HOSPITAL_COMMUNITY): Payer: Self-pay

## 2022-05-27 ENCOUNTER — Other Ambulatory Visit (HOSPITAL_COMMUNITY): Payer: Self-pay

## 2022-07-11 ENCOUNTER — Other Ambulatory Visit (HOSPITAL_COMMUNITY): Payer: Self-pay

## 2022-07-12 ENCOUNTER — Other Ambulatory Visit (HOSPITAL_COMMUNITY): Payer: Self-pay

## 2022-07-12 MED ORDER — LEVOTHYROXINE SODIUM 50 MCG PO TABS
50.0000 ug | ORAL_TABLET | Freq: Every morning | ORAL | 2 refills | Status: DC
  Filled 2022-07-13: qty 90, 90d supply, fill #0
  Filled 2022-10-09: qty 90, 90d supply, fill #1
  Filled 2023-01-08: qty 90, 90d supply, fill #2

## 2022-07-13 ENCOUNTER — Other Ambulatory Visit (HOSPITAL_COMMUNITY): Payer: Self-pay

## 2022-09-29 ENCOUNTER — Other Ambulatory Visit (HOSPITAL_COMMUNITY): Payer: Self-pay

## 2022-10-11 ENCOUNTER — Other Ambulatory Visit: Payer: Self-pay

## 2022-10-13 ENCOUNTER — Other Ambulatory Visit (HOSPITAL_COMMUNITY): Payer: Self-pay

## 2022-10-19 ENCOUNTER — Other Ambulatory Visit (HOSPITAL_COMMUNITY): Payer: Self-pay

## 2022-10-21 ENCOUNTER — Other Ambulatory Visit (HOSPITAL_COMMUNITY): Payer: Self-pay

## 2022-10-21 MED ORDER — SERTRALINE HCL 50 MG PO TABS
ORAL_TABLET | ORAL | 1 refills | Status: DC
  Filled 2022-10-21: qty 90, 90d supply, fill #0
  Filled 2023-01-23: qty 90, 90d supply, fill #1

## 2022-10-21 MED ORDER — METOPROLOL TARTRATE 50 MG PO TABS
ORAL_TABLET | ORAL | 1 refills | Status: DC
  Filled 2022-10-21: qty 180, 90d supply, fill #0
  Filled 2023-01-23: qty 180, 90d supply, fill #1

## 2022-11-23 ENCOUNTER — Other Ambulatory Visit (HOSPITAL_COMMUNITY): Payer: Self-pay

## 2022-12-27 ENCOUNTER — Other Ambulatory Visit (HOSPITAL_COMMUNITY): Payer: Self-pay

## 2023-02-19 ENCOUNTER — Other Ambulatory Visit (HOSPITAL_COMMUNITY): Payer: Self-pay

## 2023-02-19 MED ORDER — HYDROCHLOROTHIAZIDE 25 MG PO TABS
25.0000 mg | ORAL_TABLET | Freq: Every day | ORAL | 0 refills | Status: DC
  Filled 2023-02-19: qty 90, 90d supply, fill #0

## 2023-02-21 ENCOUNTER — Other Ambulatory Visit (HOSPITAL_COMMUNITY): Payer: Self-pay

## 2023-02-21 MED ORDER — SIMVASTATIN 20 MG PO TABS
20.0000 mg | ORAL_TABLET | Freq: Every evening | ORAL | 0 refills | Status: DC
  Filled 2023-02-21: qty 90, 90d supply, fill #0

## 2023-03-15 DIAGNOSIS — Z23 Encounter for immunization: Secondary | ICD-10-CM | POA: Diagnosis not present

## 2023-03-26 ENCOUNTER — Other Ambulatory Visit (HOSPITAL_COMMUNITY): Payer: Self-pay

## 2023-03-28 ENCOUNTER — Other Ambulatory Visit (HOSPITAL_COMMUNITY): Payer: Self-pay

## 2023-03-28 MED ORDER — AMLODIPINE BESYLATE 10 MG PO TABS
10.0000 mg | ORAL_TABLET | Freq: Every day | ORAL | 0 refills | Status: DC
  Filled 2023-03-28: qty 30, 30d supply, fill #0

## 2023-04-10 ENCOUNTER — Other Ambulatory Visit (HOSPITAL_COMMUNITY): Payer: Self-pay

## 2023-04-11 ENCOUNTER — Other Ambulatory Visit (HOSPITAL_COMMUNITY): Payer: Self-pay

## 2023-04-11 MED ORDER — LEVOTHYROXINE SODIUM 50 MCG PO TABS
50.0000 ug | ORAL_TABLET | Freq: Every morning | ORAL | 0 refills | Status: DC
  Filled 2023-04-11: qty 30, 30d supply, fill #0

## 2023-04-13 ENCOUNTER — Other Ambulatory Visit (HOSPITAL_COMMUNITY): Payer: Self-pay

## 2023-04-21 ENCOUNTER — Other Ambulatory Visit (HOSPITAL_COMMUNITY): Payer: Self-pay

## 2023-04-21 DIAGNOSIS — E039 Hypothyroidism, unspecified: Secondary | ICD-10-CM | POA: Diagnosis not present

## 2023-04-21 DIAGNOSIS — R7301 Impaired fasting glucose: Secondary | ICD-10-CM | POA: Diagnosis not present

## 2023-04-21 DIAGNOSIS — E559 Vitamin D deficiency, unspecified: Secondary | ICD-10-CM | POA: Diagnosis not present

## 2023-04-21 DIAGNOSIS — Z0000 Encounter for general adult medical examination without abnormal findings: Secondary | ICD-10-CM | POA: Diagnosis not present

## 2023-04-21 DIAGNOSIS — Z79899 Other long term (current) drug therapy: Secondary | ICD-10-CM | POA: Diagnosis not present

## 2023-04-21 DIAGNOSIS — L570 Actinic keratosis: Secondary | ICD-10-CM | POA: Diagnosis not present

## 2023-04-21 DIAGNOSIS — I700 Atherosclerosis of aorta: Secondary | ICD-10-CM | POA: Diagnosis not present

## 2023-04-21 DIAGNOSIS — I10 Essential (primary) hypertension: Secondary | ICD-10-CM | POA: Diagnosis not present

## 2023-04-21 DIAGNOSIS — F17210 Nicotine dependence, cigarettes, uncomplicated: Secondary | ICD-10-CM | POA: Diagnosis not present

## 2023-04-21 DIAGNOSIS — J439 Emphysema, unspecified: Secondary | ICD-10-CM | POA: Diagnosis not present

## 2023-04-21 MED ORDER — LEVOTHYROXINE SODIUM 50 MCG PO TABS
50.0000 ug | ORAL_TABLET | Freq: Every morning | ORAL | 3 refills | Status: AC
  Filled 2023-04-21 – 2023-05-11 (×2): qty 90, 90d supply, fill #0
  Filled 2023-08-08: qty 90, 90d supply, fill #1
  Filled 2023-11-09: qty 90, 90d supply, fill #2
  Filled 2024-02-07: qty 90, 90d supply, fill #3

## 2023-04-21 MED ORDER — SIMVASTATIN 20 MG PO TABS
20.0000 mg | ORAL_TABLET | Freq: Every evening | ORAL | 3 refills | Status: DC
  Filled 2023-05-19: qty 90, 90d supply, fill #0
  Filled 2023-08-17: qty 90, 90d supply, fill #1
  Filled 2023-11-23: qty 90, 90d supply, fill #2
  Filled 2024-02-27: qty 90, 90d supply, fill #3

## 2023-04-21 MED ORDER — ALBUTEROL SULFATE HFA 108 (90 BASE) MCG/ACT IN AERS
2.0000 | INHALATION_SPRAY | RESPIRATORY_TRACT | 3 refills | Status: AC | PRN
  Filled 2023-04-21: qty 6.7, 17d supply, fill #0

## 2023-04-21 MED ORDER — SERTRALINE HCL 50 MG PO TABS
50.0000 mg | ORAL_TABLET | Freq: Every day | ORAL | 3 refills | Status: DC
  Filled 2023-04-21: qty 90, 90d supply, fill #0
  Filled 2023-07-24 – 2023-07-26 (×2): qty 90, 90d supply, fill #1
  Filled 2023-10-20: qty 90, 90d supply, fill #2
  Filled 2024-01-16: qty 90, 90d supply, fill #3

## 2023-04-21 MED ORDER — HYDROCHLOROTHIAZIDE 25 MG PO TABS
25.0000 mg | ORAL_TABLET | Freq: Every day | ORAL | 3 refills | Status: DC
  Filled 2023-05-19: qty 90, 90d supply, fill #0
  Filled 2023-08-17: qty 90, 90d supply, fill #1
  Filled 2023-11-09: qty 90, 90d supply, fill #2
  Filled 2024-02-07: qty 90, 90d supply, fill #3

## 2023-04-21 MED ORDER — BREZTRI AEROSPHERE 160-9-4.8 MCG/ACT IN AERO
1.0000 | INHALATION_SPRAY | Freq: Two times a day (BID) | RESPIRATORY_TRACT | 3 refills | Status: AC
  Filled 2023-04-21: qty 32.1, 90d supply, fill #0

## 2023-04-21 MED ORDER — METOPROLOL TARTRATE 50 MG PO TABS
50.0000 mg | ORAL_TABLET | Freq: Two times a day (BID) | ORAL | 3 refills | Status: AC
  Filled 2023-04-21: qty 180, 90d supply, fill #0
  Filled 2023-07-24 – 2023-07-26 (×2): qty 180, 90d supply, fill #1
  Filled 2023-10-20: qty 180, 90d supply, fill #2
  Filled 2024-01-16: qty 180, 90d supply, fill #3

## 2023-04-21 MED ORDER — AMLODIPINE BESYLATE 10 MG PO TABS
10.0000 mg | ORAL_TABLET | Freq: Every day | ORAL | 3 refills | Status: DC
  Filled 2023-04-21: qty 90, 90d supply, fill #0
  Filled 2023-07-24 – 2023-07-26 (×2): qty 90, 90d supply, fill #1
  Filled 2023-10-20: qty 90, 90d supply, fill #2
  Filled 2024-01-29: qty 90, 90d supply, fill #3

## 2023-04-23 ENCOUNTER — Other Ambulatory Visit (HOSPITAL_COMMUNITY): Payer: Self-pay

## 2023-05-05 ENCOUNTER — Other Ambulatory Visit: Payer: Self-pay | Admitting: Family Medicine

## 2023-05-05 DIAGNOSIS — F17210 Nicotine dependence, cigarettes, uncomplicated: Secondary | ICD-10-CM

## 2023-05-06 ENCOUNTER — Emergency Department (HOSPITAL_BASED_OUTPATIENT_CLINIC_OR_DEPARTMENT_OTHER): Payer: Medicare Other | Admitting: Radiology

## 2023-05-06 ENCOUNTER — Other Ambulatory Visit (HOSPITAL_BASED_OUTPATIENT_CLINIC_OR_DEPARTMENT_OTHER): Payer: Self-pay

## 2023-05-06 ENCOUNTER — Encounter (HOSPITAL_BASED_OUTPATIENT_CLINIC_OR_DEPARTMENT_OTHER): Payer: Self-pay

## 2023-05-06 ENCOUNTER — Emergency Department (HOSPITAL_BASED_OUTPATIENT_CLINIC_OR_DEPARTMENT_OTHER)
Admission: EM | Admit: 2023-05-06 | Discharge: 2023-05-06 | Disposition: A | Discharge status: Home / Self Care | Payer: Medicare Other | Attending: Emergency Medicine | Admitting: Emergency Medicine

## 2023-05-06 ENCOUNTER — Other Ambulatory Visit: Payer: Self-pay

## 2023-05-06 DIAGNOSIS — M25521 Pain in right elbow: Secondary | ICD-10-CM | POA: Diagnosis not present

## 2023-05-06 DIAGNOSIS — M1811 Unilateral primary osteoarthritis of first carpometacarpal joint, right hand: Secondary | ICD-10-CM | POA: Diagnosis not present

## 2023-05-06 DIAGNOSIS — S4991XA Unspecified injury of right shoulder and upper arm, initial encounter: Secondary | ICD-10-CM | POA: Diagnosis present

## 2023-05-06 DIAGNOSIS — M79641 Pain in right hand: Secondary | ICD-10-CM | POA: Diagnosis not present

## 2023-05-06 DIAGNOSIS — M19011 Primary osteoarthritis, right shoulder: Secondary | ICD-10-CM | POA: Diagnosis not present

## 2023-05-06 DIAGNOSIS — W010XXA Fall on same level from slipping, tripping and stumbling without subsequent striking against object, initial encounter: Secondary | ICD-10-CM | POA: Diagnosis not present

## 2023-05-06 DIAGNOSIS — W19XXXA Unspecified fall, initial encounter: Secondary | ICD-10-CM

## 2023-05-06 DIAGNOSIS — M19021 Primary osteoarthritis, right elbow: Secondary | ICD-10-CM | POA: Diagnosis not present

## 2023-05-06 DIAGNOSIS — M79601 Pain in right arm: Secondary | ICD-10-CM | POA: Diagnosis not present

## 2023-05-06 DIAGNOSIS — Z7982 Long term (current) use of aspirin: Secondary | ICD-10-CM | POA: Diagnosis not present

## 2023-05-06 DIAGNOSIS — S52124A Nondisplaced fracture of head of right radius, initial encounter for closed fracture: Secondary | ICD-10-CM | POA: Insufficient documentation

## 2023-05-06 DIAGNOSIS — M25531 Pain in right wrist: Secondary | ICD-10-CM | POA: Diagnosis not present

## 2023-05-06 MED ORDER — OXYCODONE HCL 5 MG PO TABS
5.0000 mg | ORAL_TABLET | Freq: Four times a day (QID) | ORAL | 0 refills | Status: AC | PRN
  Filled 2023-05-06: qty 6, 2d supply, fill #0

## 2023-05-06 NOTE — ED Provider Notes (Signed)
[place] [facility]GENC[facility] DEPARTMENT AT [facility] Provider Note   CSN: [id number] Arrival date & time: 05/06/23  1242     History  No chief complaint on file.   Wendy Blair is a 72 y.o. female.  Patient presents to the emergency department today for evaluation of right arm pain after a fall.  Patient had a mechanical fall at her job yesterday.  She states that she tripped over somebody's foot.  She fell onto her knees and right arm.  She sustained abrasions to the bilateral knees.  She has had pain in the shoulder, elbow, and wrist since yesterday.  Wrist pain is most substantial today, however elbow pain was worse yesterday.  No distal numbness or tingling.  She does have some difficulty straightening her arm at the elbow and supinating at the wrist.  She has taken over-the-counter medications.  She has a sling at home.  History of right shoulder surgery and lower extremity surgeries with Dr. Eulah Pont.       Home Medications Prior to Admission medications   Medication Sig Start Date End Date Taking? Authorizing Provider  albuterol (PROAIR HFA) 108 (90 Base) MCG/ACT inhaler Inhale 2 puffs into the lungs every 4 hrs as needed 12/31/20     albuterol (VENTOLIN HFA) 108 (90 Base) MCG/ACT inhaler Inhale 2 puffs into the lungs every 4 hours 07/02/21     albuterol (VENTOLIN HFA) 108 (90 Base) MCG/ACT inhaler Inhale 2 puffs into the lungs every 4 (four) hours. 03/25/22     albuterol (VENTOLIN HFA) 108 (90 Base) MCG/ACT inhaler Inhale 2 puffs into the lungs every 4 (four) hours as needed. 04/21/23     amLODipine (NORVASC) 10 MG tablet Take 10 mg by mouth at bedtime.  11/29/19   [provider]  amLODipine (NORVASC) 10 MG tablet TAKE 1 TABLET BY MOUTH ONCE DAILY 12/28/19 12/27/20  Mila Palmer, MD  amLODipine (NORVASC) 10 MG tablet TAKE 1 TABLET BY MOUTH DAILY 03/18/21     amLODipine (NORVASC) 10 MG tablet Take 1 tablet (10 mg total) by mouth daily. 04/21/23     Apoaequorin  (PREVAGEN) 10 MG CAPS Take 10 mg by mouth daily.    [provider]  aspirin EC 81 MG tablet Take 1 tablet (81 mg total) by mouth 2 (two) times daily. For DVT prophylaxis for 30 days after surgery. 07/01/20   Jenne Pane, PA-C  Budeson-Glycopyrrol-Formoterol (BREZTRI AEROSPHERE) 160-9-4.8 MCG/ACT AERO Inhale 1 puff into the lungs daily.    [provider]  Budeson-Glycopyrrol-Formoterol (BREZTRI AEROSPHERE) 160-9-4.8 MCG/ACT AERO Inhale 1 puff into the lungs twice a day 12/31/20     Budeson-Glycopyrrol-Formoterol (BREZTRI AEROSPHERE) 160-9-4.8 MCG/ACT AERO Inhale 1 puff into the lungs 2 (two) times daily. 04/21/23     Calcium Carb-Cholecalciferol (CALCIUM 600/VITAMIN D3 PO) Take 1 tablet by mouth daily.    [provider]  cetirizine (ZYRTEC) 10 MG tablet Take 10 mg by mouth daily.    [provider]  Cholecalciferol (VITAMIN D-3) 1000 UNITS CAPS Take 2,000 Units by mouth at bedtime. Gummie    [provider]  hydrochlorothiazide (HYDRODIURIL) 25 MG tablet Take 25 mg by mouth daily.  12/28/19   [provider]  hydrochlorothiazide (HYDRODIURIL) 25 MG tablet TAKE 1 TABLET BY MOUTH ONCE DAILY IN THE MORNING 12/28/19 01/27/21  Mila Palmer, MD  hydrochlorothiazide (HYDRODIURIL) 25 MG tablet Take 1 tablet (25 mg total) by mouth daily. 04/21/23     levothyroxine (SYNTHROID) 50 MCG tablet Take 50 mcg  by mouth daily before breakfast.    [provider]  levothyroxine (SYNTHROID) 50 MCG tablet Take tablet by mouth in the morning on an empty stomach 01/14/21     levothyroxine (SYNTHROID) 50 MCG tablet Take 1 tablet by mouth daily. 01/15/22   Mila Palmer, MD  levothyroxine (SYNTHROID) 50 MCG tablet Take 1 tablet by mouth in the morning on an empty stomach 01/15/22     levothyroxine (SYNTHROID) 50 MCG tablet Take 1 tablet (50 mcg total) by mouth in the morning on an empty stomach. 04/21/23     metoprolol tartrate (LOPRESSOR) 50 MG tablet Take 50 mg by  mouth 2 (two) times daily. 12/28/19   [provider]  metoprolol tartrate (LOPRESSOR) 50 MG tablet TAKE 1 TABLET BY MOUTH TWICE DAILY WITH FOOD 12/28/19 12/27/20  Mila Palmer, MD  metoprolol tartrate (LOPRESSOR) 50 MG tablet Take 1 tablet (50 mg total) by mouth 2 (two) times daily. 12/29/20     metoprolol tartrate (LOPRESSOR) 50 MG tablet Take 1 tablet (50 mg total) by mouth 2 (two) times daily with food. 04/21/23     Multiple Vitamins-Minerals (MULTIVITAMIN PO) Take 1 tablet by mouth daily.    [provider]  ofloxacin (OCUFLOX) 0.3 % ophthalmic solution Place 1 drop into left eye 3 times a day x 1 week, then twice a day x 1 week, then once a day for 14 days 09/02/20     oxyCODONE (OXY IR/ROXICODONE) 5 MG immediate release tablet Take 1 tablet (5 mg total) by mouth every 6 (six) hours as needed for severe pain (pain score 7-10). 05/06/23  Yes Renne Crigler, PA-C  prednisoLONE acetate (PRED FORTE) 1 % ophthalmic suspension Place 1 drop into left eye 3 times a day x 1 week, then twice a day x 1 week, then once a day for 14 days 09/02/20     rosuvastatin (CRESTOR) 20 MG tablet Take 20 mg by mouth daily.    [provider]  sertraline (ZOLOFT) 50 MG tablet Take 50 mg by mouth daily.    [provider]  sertraline (ZOLOFT) 50 MG tablet TAKE 1 TABLET BY MOUTH ONCE DAILY 12/28/19 12/27/20  Mila Palmer, MD  sertraline (ZOLOFT) 50 MG tablet Take 1 tablet (50 mg total) by mouth daily. 12/29/20     sertraline (ZOLOFT) 50 MG tablet Take 1 tablet (50 mg total) by mouth daily. 04/21/23     simvastatin (ZOCOR) 20 MG tablet TAKE 1 TABLET BY MOUTH EVERY DAY IN THE EVENING 08/11/20 08/11/21  Mila Palmer, MD  simvastatin (ZOCOR) 20 MG tablet Take 1 tablet (20 mg total) by mouth every evening. 04/21/23     Sod Picosulfate-Mag Ox-Cit Acd (CLENPIQ) 10-3.5-12 MG-GM -GM/160ML SOLN Drink as directed by instructions from providers office. Do not refrigerate. 07/13/21         Allergies     Molds & smuts and Penicillins    Review of Systems   Review of Systems  Physical Exam Updated Vital Signs BP (!) 161/77 (BP Location: Left Arm)   Pulse 74   Temp 98 F (36.7 C)   Resp 16   SpO2 96%  Physical Exam Vitals and nursing note reviewed.  Constitutional:      Appearance: She is well-developed.  HENT:     Head: Normocephalic and atraumatic.  Eyes:     Pupils: Pupils are equal, round, and reactive to light.  Cardiovascular:     Pulses: Normal pulses. No decreased pulses.  Musculoskeletal:  General: Tenderness present.     Right shoulder: Tenderness present. Normal range of motion.     Right elbow: Decreased range of motion. Tenderness present in radial head.     Right forearm: No swelling or tenderness.     Right wrist: Tenderness and bony tenderness present. Decreased range of motion.     Cervical back: Normal range of motion and neck supple.     Right knee: Normal range of motion. No tenderness.     Left knee: Normal range of motion. No tenderness.     Comments: Right shoulder: Healed surgical scar noted  Bilateral knees: Mild superficial abrasions noted, normal range of motion bilaterally  Skin:    General: Skin is warm and dry.  Neurological:     Mental Status: She is alert.     Sensory: No sensory deficit.     Comments: Motor, sensation, and vascular distal to the injury is fully intact.   Psychiatric:        Mood and Affect: Mood normal.     ED Results / Procedures / Treatments   Labs (all labs ordered are listed, but only abnormal results are displayed) Labs Reviewed - No data to display  EKG None  Radiology DG Shoulder Right Result Date: 05/06/2023 CLINICAL DATA:  Fall.  Arm pain. EXAM: RIGHT SHOULDER - 2+ VIEW COMPARISON:  Right shoulder radiographs 03/27/2018 FINDINGS: There is diffuse decreased bone mineralization. Interval relocation of the prior anterior humeral head dislocation seen on remote 03/27/2018 radiographs. Mild to  moderate glenohumeral joint space narrowing and mild-to-moderate inferior osteophytosis. On frontal view the humeral head appears to contact the undersurface of the acromion with mild scalloping of the inferior acromion. Mild acromioclavicular joint space narrowing and mild-to-moderate peripheral osteophytosis. No acute fracture is seen. No dislocation. Old healed fractures of multiple posterolateral right-sided ribs, better seen on the prior study. IMPRESSION: 1. No acute fracture is seen. 2. Mild-to-moderate glenohumeral and acromioclavicular osteoarthritis. 3. The humeral head appears to contact the undersurface of the acromion with mild scalloping of the inferior acromion. This suggests chronic abutment. Electronically Signed   By: Neita Garnet M.D.   On: 05/06/2023 15:06   DG Elbow 2 Views Right Result Date: 05/06/2023 CLINICAL DATA:  Fall fall.  Arm pain. EXAM: RIGHT ELBOW - 2 VIEW COMPARISON:  None Available. FINDINGS: There is diffuse decreased bone mineralization. Possible minimal elevation of the distal anterior humeral fat pad, a possible joint effusion. Mild radial head-neck junction circumferential degenerative osteophytes. Mild medial elbow joint space narrowing and peripheral osteophytosis. There is linear lucency overlying the radial head articular surface on frontal view. There appears to be a minimal concave indentation at the lateral radial head-neck junction on that view. Possible minimal medial displacement of the radial neck 1-2 mm. IMPRESSION: 1. Findings suspicious for an acute fracture of the radial head articular surface and radial head-neck junction without significant displacement but possible minimal medial displacement of the radial neck 1-2 mm. 2. Possible small elbow joint effusion. 3. Mild elbow osteoarthritis. Electronically Signed   By: Neita Garnet M.D.   On: 05/06/2023 15:03   DG Wrist Complete Right Result Date: 05/06/2023 CLINICAL DATA:  Fall yesterday.  Arm pain.  EXAM: RIGHT WRIST - COMPLETE 3+ VIEW COMPARISON:  None Available. FINDINGS: 2 mm ulnar positive variance. Severe thumb carpometacarpal joint space narrowing, subchondral sclerosis, and peripheral osteophytosis. Moderate triscaphe joint space narrowing. Moderate thumb metacarpophalangeal and interphalangeal joint space narrowing and peripheral osteophytosis. No acute fracture is seen. No  dislocation. IMPRESSION: 1. No acute fracture. 2. Severe thumb carpometacarpal osteoarthritis. Electronically Signed   By: Neita Garnet M.D.   On: 05/06/2023 15:00    Procedures Procedures    Medications Ordered in ED Medications - No data to display  ED Course/ Medical Decision Making/ A&P    Patient seen and examined. History obtained directly from patient. Work-up including labs, imaging, EKG ordered in triage, if performed, were reviewed.    Labs/EKG: None ordered  Imaging: Independently reviewed and interpreted.  This included: X-ray of the shoulder, agree arthritis noted; x-ray of the elbow agree likely radial head fracture; x-ray of the wrist, agree no acute fractures, arthritis noted.  Medications/Fluids: None ordered  Most recent vital signs reviewed and are as follows: BP (!) 161/77 (BP Location: Left Arm)   Pulse 74   Temp 98 F (36.7 C)   Resp 16   SpO2 96%   Initial impression: Right upper extremity pain after fall, mild abrasions to the knees  Home treatment plan: RICE protocol, sling as needed for comfort, encouraged early mobilization.  Patient would like pain medication to help her rest.  # 6 tablets oxycodone 5 mg prescribed. Use pain medication only under direct supervision at the lowest possible dose needed to control your pain.   Return instructions discussed with patient: New or worsening symptoms  Follow-up instructions discussed with patient: Follow-up with her orthopedist in 1 week for further evaluation                                 Medical Decision Making Amount  and/or Complexity of Data Reviewed Radiology: ordered.  Risk Prescription drug management.   Patient presents after mechanical fall.  She has right upper extremity pain in the wrist and the elbow that radiates up to the shoulder.  She likely has a nondisplaced to minimally displaced radial head fracture on the right side.  Distal circulation, motor, sensation intact.  No evidence of compartment syndrome.  She does have pain in her wrist with associated arthritis, no obvious fractures.  History of shoulder surgery with arthritis in the shoulder, no fracture or dislocation.  No indication for emergent orthopedic consultation at this time.         Final Clinical Impression(s) / ED Diagnoses Final diagnoses:  Closed nondisplaced fracture of head of right radius, initial encounter  Pain of right upper extremity  Fall, initial encounter    Rx / DC Orders ED Discharge Orders          Ordered    oxyCODONE (OXY IR/ROXICODONE) 5 MG immediate release tablet  Every 6 hours PRN        05/06/23 1527              Renne Crigler, PA-C 05/06/23 1539    Rolan Bucco, MD 05/06/23 2341

## 2023-05-06 NOTE — ED Triage Notes (Signed)
Pt c/o R wrist/ arm pain after mechanical fall yesterday at work. Advises pain goes from R wrist to R elbow, "then sometimes shoots to my shoulder." Abrasions to BLE knees, ambulatory to triage.

## 2023-05-06 NOTE — Discharge Instructions (Signed)
Please read and follow all provided instructions.  Your diagnoses today include:  1. Closed nondisplaced fracture of head of right radius, initial encounter   2. Pain of right upper extremity   3. Fall, initial encounter     Tests performed today include: An x-ray of the affected areas - show suspected radial head fracture, arthritis in the shoulder and the wrist Vital signs. See below for your results today.   Medications prescribed:  Oxycodone - narcotic pain medication  DO NOT drive or perform any activities that require you to be awake and alert because this medicine can make you drowsy.   Use pain medication only under direct supervision at the lowest possible dose needed to control your pain.   Take any prescribed medications only as directed.  Home care instructions:  Follow any educational materials contained in this packet Follow R.I.C.E. Protocol: R - rest your injury  I  - use ice on injury without applying directly to skin C - compress injury with bandage or splint E - elevate the injury as much as possible  Follow-up instructions: Please follow-up with your orthopedic physician (bone specialist) if you continue to have significant pain in 1 week.   Return instructions:  Please return if your fingers are numb or tingling, appear gray or blue, or you have severe pain (also elevate the arm and loosen splint or wrap if you were given one) Please return to the Emergency Department if you experience worsening symptoms.  Please return if you have any other emergent concerns.  Additional Information:  Your vital signs today were: BP (!) 161/77 (BP Location: Left Arm)   Pulse 74   Temp 98 F (36.7 C)   Resp 16   SpO2 96%  If your blood pressure (BP) was elevated above 135/85 this visit, please have this repeated by your doctor within one month. --------------

## 2023-05-06 NOTE — ED Notes (Signed)
Discharge paperwork given and verbally understood. 

## 2023-05-11 ENCOUNTER — Other Ambulatory Visit (HOSPITAL_COMMUNITY): Payer: Self-pay

## 2023-05-12 ENCOUNTER — Other Ambulatory Visit (HOSPITAL_COMMUNITY): Payer: Self-pay

## 2023-05-13 ENCOUNTER — Other Ambulatory Visit (HOSPITAL_COMMUNITY): Payer: Self-pay

## 2023-05-16 DIAGNOSIS — M25511 Pain in right shoulder: Secondary | ICD-10-CM | POA: Diagnosis not present

## 2023-05-19 ENCOUNTER — Other Ambulatory Visit: Payer: Self-pay

## 2023-05-19 ENCOUNTER — Other Ambulatory Visit (HOSPITAL_COMMUNITY): Payer: Self-pay

## 2023-05-20 ENCOUNTER — Other Ambulatory Visit: Payer: Self-pay | Admitting: Family Medicine

## 2023-05-20 ENCOUNTER — Other Ambulatory Visit (HOSPITAL_COMMUNITY): Payer: Self-pay

## 2023-05-20 DIAGNOSIS — Z1231 Encounter for screening mammogram for malignant neoplasm of breast: Secondary | ICD-10-CM

## 2023-05-21 ENCOUNTER — Other Ambulatory Visit (HOSPITAL_COMMUNITY): Payer: Self-pay

## 2023-05-30 DIAGNOSIS — S52124A Nondisplaced fracture of head of right radius, initial encounter for closed fracture: Secondary | ICD-10-CM | POA: Diagnosis not present

## 2023-06-07 DIAGNOSIS — M25621 Stiffness of right elbow, not elsewhere classified: Secondary | ICD-10-CM | POA: Diagnosis not present

## 2023-06-07 DIAGNOSIS — S52121D Displaced fracture of head of right radius, subsequent encounter for closed fracture with routine healing: Secondary | ICD-10-CM | POA: Diagnosis not present

## 2023-06-07 DIAGNOSIS — M6281 Muscle weakness (generalized): Secondary | ICD-10-CM | POA: Diagnosis not present

## 2023-06-13 DIAGNOSIS — S52121D Displaced fracture of head of right radius, subsequent encounter for closed fracture with routine healing: Secondary | ICD-10-CM | POA: Diagnosis not present

## 2023-06-13 DIAGNOSIS — M6281 Muscle weakness (generalized): Secondary | ICD-10-CM | POA: Diagnosis not present

## 2023-06-13 DIAGNOSIS — M25621 Stiffness of right elbow, not elsewhere classified: Secondary | ICD-10-CM | POA: Diagnosis not present

## 2023-06-17 ENCOUNTER — Ambulatory Visit
Admission: RE | Admit: 2023-06-17 | Discharge: 2023-06-17 | Disposition: A | Discharge status: Home / Self Care | Payer: Medicare Other | Source: Ambulatory Visit | Attending: Family Medicine | Admitting: Family Medicine

## 2023-06-17 DIAGNOSIS — Z1231 Encounter for screening mammogram for malignant neoplasm of breast: Secondary | ICD-10-CM | POA: Diagnosis not present

## 2023-06-20 DIAGNOSIS — M25621 Stiffness of right elbow, not elsewhere classified: Secondary | ICD-10-CM | POA: Diagnosis not present

## 2023-06-20 DIAGNOSIS — S52121D Displaced fracture of head of right radius, subsequent encounter for closed fracture with routine healing: Secondary | ICD-10-CM | POA: Diagnosis not present

## 2023-06-20 DIAGNOSIS — M6281 Muscle weakness (generalized): Secondary | ICD-10-CM | POA: Diagnosis not present

## 2023-06-27 DIAGNOSIS — M6281 Muscle weakness (generalized): Secondary | ICD-10-CM | POA: Diagnosis not present

## 2023-06-27 DIAGNOSIS — S52121D Displaced fracture of head of right radius, subsequent encounter for closed fracture with routine healing: Secondary | ICD-10-CM | POA: Diagnosis not present

## 2023-06-27 DIAGNOSIS — M25621 Stiffness of right elbow, not elsewhere classified: Secondary | ICD-10-CM | POA: Diagnosis not present

## 2023-07-06 DIAGNOSIS — S52121D Displaced fracture of head of right radius, subsequent encounter for closed fracture with routine healing: Secondary | ICD-10-CM | POA: Diagnosis not present

## 2023-07-26 ENCOUNTER — Other Ambulatory Visit (HOSPITAL_COMMUNITY): Payer: Self-pay

## 2023-08-30 ENCOUNTER — Encounter: Payer: Self-pay | Admitting: *Deleted

## 2023-10-24 ENCOUNTER — Other Ambulatory Visit (HOSPITAL_COMMUNITY): Payer: Self-pay

## 2023-11-26 ENCOUNTER — Other Ambulatory Visit (HOSPITAL_COMMUNITY): Payer: Self-pay

## 2024-04-17 ENCOUNTER — Other Ambulatory Visit (HOSPITAL_COMMUNITY): Payer: Self-pay

## 2024-04-18 ENCOUNTER — Other Ambulatory Visit (HOSPITAL_COMMUNITY): Payer: Self-pay

## 2024-04-18 ENCOUNTER — Other Ambulatory Visit: Payer: Self-pay

## 2024-04-18 MED ORDER — METOPROLOL TARTRATE 50 MG PO TABS
50.0000 mg | ORAL_TABLET | Freq: Two times a day (BID) | ORAL | 3 refills | Status: AC
  Filled 2024-04-18: qty 180, 90d supply, fill #0

## 2024-04-18 MED ORDER — SERTRALINE HCL 50 MG PO TABS
50.0000 mg | ORAL_TABLET | Freq: Every day | ORAL | 3 refills | Status: AC
  Filled 2024-04-18: qty 90, 90d supply, fill #0

## 2024-04-27 ENCOUNTER — Other Ambulatory Visit (HOSPITAL_COMMUNITY): Payer: Self-pay

## 2024-04-27 MED ORDER — BUPROPION HCL ER (XL) 150 MG PO TB24
150.0000 mg | ORAL_TABLET | Freq: Every morning | ORAL | 0 refills | Status: AC
  Filled 2024-04-27: qty 90, 90d supply, fill #0

## 2024-05-01 ENCOUNTER — Encounter: Payer: Self-pay | Admitting: Student

## 2024-05-01 DIAGNOSIS — Z720 Tobacco use: Secondary | ICD-10-CM

## 2024-05-08 ENCOUNTER — Other Ambulatory Visit (HOSPITAL_COMMUNITY): Payer: Self-pay

## 2024-05-15 ENCOUNTER — Other Ambulatory Visit (HOSPITAL_COMMUNITY): Payer: Self-pay

## 2024-05-15 MED ORDER — AMLODIPINE BESYLATE 10 MG PO TABS
10.0000 mg | ORAL_TABLET | Freq: Every day | ORAL | 3 refills | Status: AC
  Filled 2024-05-15 – 2024-05-16 (×2): qty 90, 90d supply, fill #0

## 2024-05-15 MED ORDER — HYDROCHLOROTHIAZIDE 25 MG PO TABS
25.0000 mg | ORAL_TABLET | Freq: Every day | ORAL | 3 refills | Status: AC
  Filled 2024-05-15 – 2024-05-16 (×2): qty 90, 90d supply, fill #0

## 2024-05-15 MED ORDER — LEVOTHYROXINE SODIUM 50 MCG PO TABS
50.0000 ug | ORAL_TABLET | Freq: Every morning | ORAL | 3 refills | Status: AC
  Filled 2024-05-15 – 2024-05-16 (×2): qty 90, 90d supply, fill #0

## 2024-05-16 ENCOUNTER — Other Ambulatory Visit (HOSPITAL_COMMUNITY): Payer: Self-pay

## 2024-05-29 ENCOUNTER — Other Ambulatory Visit (HOSPITAL_COMMUNITY): Payer: Self-pay

## 2024-05-29 MED ORDER — SIMVASTATIN 20 MG PO TABS
20.0000 mg | ORAL_TABLET | Freq: Every evening | ORAL | 3 refills | Status: AC
  Filled 2024-05-29: qty 90, 90d supply, fill #0

## 2024-06-12 ENCOUNTER — Other Ambulatory Visit (HOSPITAL_COMMUNITY): Payer: Self-pay

## 2024-06-22 ENCOUNTER — Encounter (HOSPITAL_BASED_OUTPATIENT_CLINIC_OR_DEPARTMENT_OTHER): Payer: Self-pay

## 2024-06-22 ENCOUNTER — Other Ambulatory Visit: Payer: Self-pay

## 2024-06-22 ENCOUNTER — Emergency Department (HOSPITAL_BASED_OUTPATIENT_CLINIC_OR_DEPARTMENT_OTHER): Admission: EM | Admit: 2024-06-22 | Source: Home / Self Care

## 2024-06-22 ENCOUNTER — Emergency Department (HOSPITAL_BASED_OUTPATIENT_CLINIC_OR_DEPARTMENT_OTHER): Admitting: Radiology

## 2024-06-22 NOTE — ED Triage Notes (Signed)
 Pt c/o cough x2wks, was coughing up yellow/ green but now it's not coming up.   Hx COPD, bronchitis
